# Patient Record
Sex: Female | Born: 1965 | Race: White | Hispanic: No | Marital: Married | Smoking: Never smoker
Health system: Southern US, Community
[De-identification: ages and names within clinical notes are randomized; demographics above are authoritative.]

## PROBLEM LIST (undated history)

## (undated) DIAGNOSIS — Z85828 Personal history of other malignant neoplasm of skin: Secondary | ICD-10-CM

## (undated) DIAGNOSIS — L570 Actinic keratosis: Secondary | ICD-10-CM

## (undated) DIAGNOSIS — J040 Acute laryngitis: Secondary | ICD-10-CM

## (undated) DIAGNOSIS — N6019 Diffuse cystic mastopathy of unspecified breast: Secondary | ICD-10-CM

## (undated) HISTORY — DX: Personal history of other malignant neoplasm of skin: Z85.828

## (undated) HISTORY — DX: Actinic keratosis: L57.0

## (undated) HISTORY — DX: Diffuse cystic mastopathy of unspecified breast: N60.19

## (undated) HISTORY — DX: Acute laryngitis: J04.0

---

## 1990-01-09 HISTORY — PX: TUBAL LIGATION: SHX77

## 2004-03-01 ENCOUNTER — Ambulatory Visit: Payer: Self-pay | Admitting: Unknown Physician Specialty

## 2005-03-21 ENCOUNTER — Ambulatory Visit: Payer: Self-pay | Admitting: Unknown Physician Specialty

## 2006-03-23 ENCOUNTER — Ambulatory Visit: Payer: Self-pay | Admitting: Unknown Physician Specialty

## 2007-03-28 ENCOUNTER — Ambulatory Visit: Payer: Self-pay | Admitting: Unknown Physician Specialty

## 2008-03-31 ENCOUNTER — Ambulatory Visit: Payer: Self-pay | Admitting: Unknown Physician Specialty

## 2009-01-09 DIAGNOSIS — J040 Acute laryngitis: Secondary | ICD-10-CM

## 2009-01-09 DIAGNOSIS — N6019 Diffuse cystic mastopathy of unspecified breast: Secondary | ICD-10-CM

## 2009-01-09 HISTORY — DX: Diffuse cystic mastopathy of unspecified breast: N60.19

## 2009-01-09 HISTORY — DX: Acute laryngitis: J04.0

## 2009-04-01 ENCOUNTER — Ambulatory Visit: Payer: Self-pay | Admitting: Unknown Physician Specialty

## 2009-06-28 ENCOUNTER — Emergency Department: Payer: Self-pay | Admitting: Emergency Medicine

## 2010-01-09 HISTORY — PX: BREAST BIOPSY: SHX20

## 2010-04-04 ENCOUNTER — Ambulatory Visit: Payer: Self-pay | Admitting: General Surgery

## 2010-04-05 ENCOUNTER — Ambulatory Visit: Payer: Self-pay | Admitting: General Surgery

## 2010-04-25 ENCOUNTER — Ambulatory Visit: Payer: Self-pay | Admitting: General Surgery

## 2010-04-27 LAB — PATHOLOGY REPORT

## 2010-09-06 ENCOUNTER — Ambulatory Visit: Payer: Self-pay | Admitting: General Surgery

## 2011-01-10 HISTORY — PX: COLONOSCOPY: SHX174

## 2011-04-14 ENCOUNTER — Ambulatory Visit: Payer: Self-pay | Admitting: General Surgery

## 2012-01-10 HISTORY — PX: BREAST BIOPSY: SHX20

## 2012-03-02 ENCOUNTER — Encounter: Payer: Self-pay | Admitting: *Deleted

## 2012-04-15 ENCOUNTER — Encounter: Payer: Self-pay | Admitting: General Surgery

## 2012-04-15 ENCOUNTER — Ambulatory Visit: Payer: Self-pay | Admitting: General Surgery

## 2012-04-15 NOTE — Progress Notes (Signed)
Quick Note:  Make sure the additional views are done prior to her office visit ______ 

## 2012-04-17 ENCOUNTER — Encounter: Payer: Self-pay | Admitting: General Surgery

## 2012-04-17 ENCOUNTER — Ambulatory Visit: Payer: Self-pay | Admitting: General Surgery

## 2012-04-18 NOTE — Progress Notes (Signed)
Patient had additional views on 04-17-12 at [facility]. Report shows CAT 4 mammogram. Thanks.

## 2012-04-30 ENCOUNTER — Ambulatory Visit: Payer: Self-pay | Admitting: General Surgery

## 2012-05-14 ENCOUNTER — Encounter: Payer: Self-pay | Admitting: General Surgery

## 2012-05-14 ENCOUNTER — Ambulatory Visit (INDEPENDENT_AMBULATORY_CARE_PROVIDER_SITE_OTHER): Payer: BC Managed Care – PPO | Admitting: General Surgery

## 2012-05-14 VITALS — BP 124/72 | HR 76 | Resp 14 | Ht 63.0 in | Wt 138.0 lb

## 2012-05-14 DIAGNOSIS — R920 Mammographic microcalcification found on diagnostic imaging of breast: Secondary | ICD-10-CM

## 2012-05-14 NOTE — Patient Instructions (Addendum)
Patient advised on stereotactic biopsy which she is familiar with.  Patient has been scheduled for a right stereotactic biopsy at [facility] on 06-10-12 at 3 pm. This patient was offered to have biopsy completed on 05-23-12 but declined. She is aware of all instructions.

## 2012-05-14 NOTE — Progress Notes (Signed)
Patient ID: Sylvia Daugherty, female   DOB: Feb 09, 1965, 47 y.o.   MRN: [id number]  Chief Complaint  Patient presents with  . Other    mammo    HPI Sylvia Daugherty is a 47 y.o. female here today for her follow up mammogram done 04/17/12.  Patient reports no breast problems. Patient states she does breast self exams. Patient had right breast stereo biopsy of two micro calcifications in 2012, benign fibrous stroma with calcifications.                                                                                            HPI  History reviewed. No pertinent past medical history.  History reviewed. No pertinent past surgical history.  History reviewed. No pertinent family history.  Social History History  Substance Use Topics  . Smoking status: Never Smoker   . Smokeless tobacco: Never Used  . Alcohol Use: No    No Known Allergies  Current Outpatient Prescriptions  Medication Sig Dispense Refill  . MIMVEY 1-0.5 MG per tablet Take 1 mg by mouth daily.       No current facility-administered medications for this visit.    Review of Systems Review of Systems  Constitutional: Negative.   Respiratory: Negative.   Cardiovascular: Negative.     Blood pressure 124/72, pulse 76, resp. rate 14, height 5\' 3"  (1.6 m), weight 138 lb (62.596 kg).  Physical Exam Physical Exam  Constitutional: She is oriented to person, place, and time. She appears well-developed and well-nourished.  Eyes: Conjunctivae are normal. No scleral icterus.  Neck: Normal range of motion. Neck supple.  Cardiovascular: Normal rate, regular rhythm and normal heart sounds.   Pulmonary/Chest: Effort normal and breath sounds normal. Right breast exhibits no inverted nipple, no mass, no nipple discharge, no skin change and no tenderness. Left breast exhibits no inverted nipple, no mass, no nipple discharge, no skin change and no tenderness.  Lymphadenopathy:    She has no cervical adenopathy.    She has no axillary adenopathy.   Neurological: She is oriented to person, place, and time.    Data Reviewed Mammogram review shows a new area of micro calcifications in lateral right breast.  Assessment    New area of microcalcifications right breast     Plan    Recommended stereo biopsy and pt is agreeable.     Patient has been scheduled for a right stereotactic biopsy at [facility] on 06-10-12 at 3 pm. This patient was offered to have biopsy completed on 05-23-12 but declined. She is aware of all instructions.    Sylvia Daugherty G 05/15/2012, 5:49 AM

## 2012-05-15 ENCOUNTER — Encounter: Payer: Self-pay | Admitting: General Surgery

## 2012-06-06 ENCOUNTER — Ambulatory Visit: Payer: Self-pay | Admitting: General Surgery

## 2012-06-06 ENCOUNTER — Telehealth: Payer: Self-pay | Admitting: *Deleted

## 2012-06-06 DIAGNOSIS — R920 Mammographic microcalcification found on diagnostic imaging of breast: Secondary | ICD-10-CM

## 2012-06-06 NOTE — Telephone Encounter (Signed)
Per Erie Noe in [facility], patient will be out of town when results come back. Patient would like to be contacted at the following number: [phone]. Thanks.

## 2012-06-10 ENCOUNTER — Encounter: Payer: Self-pay | Admitting: General Surgery

## 2012-06-11 ENCOUNTER — Ambulatory Visit: Payer: Self-pay | Admitting: Family Medicine

## 2012-06-13 ENCOUNTER — Ambulatory Visit (INDEPENDENT_AMBULATORY_CARE_PROVIDER_SITE_OTHER): Payer: BC Managed Care – PPO | Admitting: *Deleted

## 2012-06-13 DIAGNOSIS — R920 Mammographic microcalcification found on diagnostic imaging of breast: Secondary | ICD-10-CM

## 2012-06-13 NOTE — Patient Instructions (Addendum)
Patient here today for follow up post right breast biopsy.  Steristrip in place and aware it may come off in one week.  Minimal bruising noted.  The patient is aware that a heating pad may be used for comfort as needed.  Aware of pathology. Follow up as scheduled. 

## 2012-06-18 ENCOUNTER — Ambulatory Visit: Payer: BC Managed Care – PPO

## 2012-11-22 ENCOUNTER — Ambulatory Visit: Payer: Self-pay | Admitting: General Surgery

## 2012-11-25 ENCOUNTER — Encounter: Payer: Self-pay | Admitting: General Surgery

## 2012-12-04 ENCOUNTER — Ambulatory Visit: Payer: BC Managed Care – PPO | Admitting: General Surgery

## 2013-01-15 ENCOUNTER — Encounter: Payer: Self-pay | Admitting: *Deleted

## 2013-11-10 ENCOUNTER — Encounter: Payer: Self-pay | Admitting: *Deleted

## 2013-12-25 ENCOUNTER — Encounter: Payer: Self-pay | Admitting: General Surgery

## 2014-03-10 ENCOUNTER — Encounter: Payer: Self-pay | Admitting: General Surgery

## 2014-03-10 ENCOUNTER — Ambulatory Visit: Payer: Self-pay | Admitting: General Surgery

## 2014-03-12 ENCOUNTER — Ambulatory Visit: Payer: Self-pay | Admitting: General Surgery

## 2014-03-26 ENCOUNTER — Encounter: Payer: Self-pay | Admitting: General Surgery

## 2014-03-26 ENCOUNTER — Ambulatory Visit: Payer: 59

## 2014-03-26 ENCOUNTER — Ambulatory Visit (INDEPENDENT_AMBULATORY_CARE_PROVIDER_SITE_OTHER): Payer: 59 | Admitting: General Surgery

## 2014-03-26 VITALS — BP 112/70 | HR 70 | Resp 12 | Ht 63.0 in | Wt 130.0 lb

## 2014-03-26 DIAGNOSIS — N6310 Unspecified lump in the right breast, unspecified quadrant: Secondary | ICD-10-CM

## 2014-03-26 DIAGNOSIS — N63 Unspecified lump in breast: Secondary | ICD-10-CM

## 2014-03-26 HISTORY — PX: BREAST BIOPSY: SHX20

## 2014-03-26 NOTE — Progress Notes (Signed)
Patient ID: Sylvia Daugherty, female   DOB: [birth year], 49 y.o.   MRN: [id number]  Chief Complaint  Patient presents with  . Follow-up    mammogram    HPI Sylvia Daugherty is a 49 y.o. female.  who presents for her follow up breast evaluation. The most recent mammogram was done on 03-10-14.  Patient does perform regular self breast checks and gets regular mammograms done.  No new breast issues. Still hurts some in right axillary area  HPI  Past Medical History  Diagnosis Date  . Diffuse cystic mastopathy 2011    RIGHT   . Laryngitis 2011    spastic dysponia     Past Surgical History  Procedure Laterality Date  . Colonoscopy  2012    DR.ELLIOTT  . Tubal ligation  1992    Family History  Problem Relation Age of Onset  . Diabetes Father     Social History History  Substance Use Topics  . Smoking status: Never Smoker   . Smokeless tobacco: Never Used  . Alcohol Use: 0.0 oz/week    0 Standard drinks or equivalent per week    No Known Allergies  Current Outpatient Prescriptions  Medication Sig Dispense Refill  . B Complex Vitamins (VITAMIN B COMPLEX IJ) Inject as directed every Monday.    . Melatonin 5 MG TABS Take by mouth at bedtime.    . Progesterone Micronized (PROGESTERONE PO) Take by mouth daily.     No current facility-administered medications for this visit.    Review of Systems Review of Systems  Constitutional: Negative.   Respiratory: Negative.   Cardiovascular: Negative.     Blood pressure 112/70, pulse 70, resp. rate 12, height 5\' 3"  (1.6 m), weight 130 lb (58.968 kg).  Physical Exam Physical Exam  Constitutional: She is oriented to person, place, and time. She appears well-developed and well-nourished.  Eyes: Conjunctivae are normal. No scleral icterus.  Neck: Neck supple.  Cardiovascular: Normal rate, regular rhythm and normal heart sounds.   Pulmonary/Chest: Effort normal and breath sounds normal. Right breast exhibits mass. Right breast exhibits  no inverted nipple, no nipple discharge, no skin change and no tenderness. Left breast exhibits no inverted nipple, no mass, no nipple discharge, no skin change and no tenderness.  Firm to hard irregular, tender axillary tail of the right breast.  Abdominal: Soft. Normal appearance. There is no tenderness.  Lymphadenopathy:    She has no cervical adenopathy.    She has no axillary adenopathy.  Neurological: She is alert and oriented to person, place, and time.  Skin: Skin is warm and dry.    Data Reviewed Mammogram reviewed-stable. Targeted US of right breast axillary tail shows dense gland with mixed echogenicity. There is a 6-84mm ill defined hypoechoic mass which appears to be very tender.  Assessment    Tender right breast mass    Plan    Core biopsy discussed and pt agreeable. Completed today. If [path is benign will recheck in 6 mos.       Sylvia Daugherty G 03/26/2014, 10:08 AM

## 2014-03-26 NOTE — Patient Instructions (Addendum)
Continue self breast exams. Call office for any new breast issues or concerns.    CARE AFTER BREAST BIOPSY  1. Leave the dressing on that your doctor applied after surgery. It is waterproof. You may bathe, shower and/or swim. The dressing will probably remain intact until your return office visit. If the dressing comes off, you will see small strips of tape against your skin on the incision. Do not remove these strips.  2. You may want to use a gauze,cloth or similar protection in your bra to prevent rubbing against your dressing and incision. This is not necessary, but you may feel more comfortable doing so.  3. It is recommended that you wear a bra day and night to give support to the breast. This will prevent the weight of the breast from pulling on the incision.  4. Your breast will feel hard and lumpy under the incision. Do not be alarmed. This is the underlying stitching of tissue. Softening of this tissue will occur in time.  5. Make sure you call the office and schedule an appointment in one week after your surgery. The office phone number is [phone]. The nurses at [facility] may have already done this for you.  6. You will notice about a week after your office visit that the strips of the tape on your incision will begin to loosen. These may then be removed.  7. Report to your doctor any of the following:  * Severe pain not relieved by your pain medication  *Redness of the incision  * Drainage from the incision  *Fever greater than 101 degrees 

## 2014-03-27 ENCOUNTER — Telehealth: Payer: Self-pay

## 2014-03-27 NOTE — Telephone Encounter (Signed)
Pathology report received from Dr Saralyn Pilar from [facility]. Fibrocystic changes consistent with calcifications. Ralls. No evidence of malignancy. Dr Jamal Collin notified of such and instructions received to call patient with results and have her return for a recheck in 6 months. Called and spoke with patient and advised of results. Patient pleased. Patient will be placed in recalls for a 6 month follow up appointment. Patient is agreeable to this.

## 2014-08-31 ENCOUNTER — Ambulatory Visit: Payer: 59 | Admitting: Podiatry

## 2014-09-08 ENCOUNTER — Ambulatory Visit (INDEPENDENT_AMBULATORY_CARE_PROVIDER_SITE_OTHER): Payer: Managed Care, Other (non HMO) | Admitting: Obstetrics and Gynecology

## 2014-09-08 ENCOUNTER — Encounter: Payer: Self-pay | Admitting: Obstetrics and Gynecology

## 2014-09-08 VITALS — BP 106/60 | HR 69 | Ht 64.0 in | Wt 126.8 lb

## 2014-09-08 DIAGNOSIS — L659 Nonscarring hair loss, unspecified: Secondary | ICD-10-CM | POA: Diagnosis not present

## 2014-09-08 DIAGNOSIS — E049 Nontoxic goiter, unspecified: Secondary | ICD-10-CM | POA: Diagnosis not present

## 2014-09-08 NOTE — Patient Instructions (Signed)
Thank you for enrolling in [facility]. Please follow the instructions below to securely access your online medical record. MyChart allows you to send messages to your doctor, view your test results, renew your prescriptions, schedule appointments, and more.  How Do I Sign Up? 1. In your Internet browser, go to [email or web address]. 2. Click on the New  User? link in the Sign In box.  3. Enter your MyChart Access Code exactly as it appears below. You will not need to use this code after you have completed the sign-up process. If you do not sign up before the expiration date, you must request a new code. MyChart Access Code: [email or web address] Expires: 11/07/2014  3:00 PM  4. Enter the last four digits of your Social Security Number (xxxx) and Date of Birth (mm/dd/yyyy) as indicated and click Next. You will be taken to the next sign-up page. 5. Create a MyChart ID. This will be your MyChart login ID and cannot be changed, so think of one that is secure and easy to remember. 6. Create a MyChart password. You can change your password at any time. 7. Enter your Password Reset Question and Answer and click Next. This can be used at a later time if you forget your password.  8. Select your communication preference, and if applicable enter your e-mail address. You will receive e-mail notification when new information is available in MyChart by choosing to receive e-mail notifications and filling in your e-mail. 9. Click Sign In. You can now view your medical record.   Additional Information If you have questions, you can email [email or web address] or call [phone] to talk to our [facility] staff. Remember, MyChart is NOT to be used for urgent needs. For medical emergencies, dial 911.

## 2014-09-08 NOTE — Progress Notes (Signed)
Subjective:     Patient ID: Sylvia Daugherty, female   DOB: [birth year], 49 y.o.   MRN: [id number]  HPI Reports sudden onset hair loss x 2- 3 months. 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  Has not changed medications or added new medications; does report increased stress with work and traveling a lot.    Review of Systems Hair loss on head only.    Objective:   Physical Exam A&O X4 Well groomed thin female in no distress Thin hair at temples and back of hair, no apparent bald spots, lots of new hair growth noted. Thyroid non-tender but enlarged left lobe noted with probable nodule palpated approximately 109mm. HR RRR Skin normal without lesions or unusual dryness    Assessment:     Hair loss Enlarged left thyroid lobe     Plan:     Labs obtained; thyroid ultrasound ordered- will follow up accordingly.  Ioanna Colquhoun Trudee Kuster, CNM

## 2014-09-09 LAB — CBC
HEMATOCRIT: 40.8 % (ref 34.0–46.6)
Hemoglobin: 13.7 g/dL (ref 11.1–15.9)
MCH: 32.2 pg (ref 26.6–33.0)
MCHC: 33.6 g/dL (ref 31.5–35.7)
MCV: 96 fL (ref 79–97)
Platelets: 312 10*3/uL (ref 150–379)
RBC: 4.26 x10E6/uL (ref 3.77–5.28)
RDW: 13.5 % (ref 12.3–15.4)
WBC: 6.8 10*3/uL (ref 3.4–10.8)

## 2014-09-09 LAB — COMPREHENSIVE METABOLIC PANEL
ALK PHOS: 44 IU/L (ref 39–117)
ALT: 8 IU/L (ref 0–32)
AST: 12 IU/L (ref 0–40)
Albumin/Globulin Ratio: 1.8 (ref 1.1–2.5)
Albumin: 4.5 g/dL (ref 3.5–5.5)
BILIRUBIN TOTAL: 0.3 mg/dL (ref 0.0–1.2)
BUN / CREAT RATIO: 14 (ref 9–23)
BUN: 13 mg/dL (ref 6–24)
CO2: 23 mmol/L (ref 18–29)
Calcium: 9.6 mg/dL (ref 8.7–10.2)
Chloride: 100 mmol/L (ref 97–108)
Creatinine, Ser: 0.9 mg/dL (ref 0.57–1.00)
GFR calc Af Amer: 86 mL/min/{1.73_m2} (ref >59)
GFR calc non Af Amer: 75 mL/min/{1.73_m2} (ref >59)
Globulin, Total: 2.5 g/dL (ref 1.5–4.5)
Glucose: 88 mg/dL (ref 65–99)
POTASSIUM: 4.3 mmol/L (ref 3.5–5.2)
Sodium: 140 mmol/L (ref 134–144)
Total Protein: 7 g/dL (ref 6.0–8.5)

## 2014-09-09 LAB — THYROGLOBULIN ANTIBODY

## 2014-09-09 LAB — FERRITIN: Ferritin: 54 ng/mL (ref 15–150)

## 2014-09-09 LAB — VITAMIN B12: Vitamin B-12: 278 pg/mL (ref 211–946)

## 2014-09-09 LAB — THYROID PANEL WITH TSH
FREE THYROXINE INDEX: 2.2 (ref 1.2–4.9)
T3 Uptake Ratio: 29 % (ref 24–39)
T4, Total: 7.7 ug/dL (ref 4.5–12.0)
TSH: 0.789 u[IU]/mL (ref 0.450–4.500)

## 2014-09-09 LAB — PROGESTERONE: PROGESTERONE: 0.6 ng/mL

## 2014-09-09 LAB — DHEA-SULFATE: DHEA SO4: 214.5 ug/dL (ref 41.2–243.7)

## 2014-09-09 LAB — PROLACTIN: Prolactin: 11.3 ng/mL (ref 4.8–23.3)

## 2014-09-09 LAB — VITAMIN D 25 HYDROXY (VIT D DEFICIENCY, FRACTURES): Vit D, 25-Hydroxy: 50.4 ng/mL (ref 30.0–100.0)

## 2014-09-09 LAB — ESTRADIOL: Estradiol: 14.2 pg/mL

## 2014-09-09 LAB — TESTOSTERONE, FREE, TOTAL, SHBG: Testosterone, Free: 1.7 pg/mL (ref 0.0–4.2)

## 2014-09-10 ENCOUNTER — Telehealth: Payer: Self-pay | Admitting: *Deleted

## 2014-09-10 NOTE — Telephone Encounter (Signed)
-----   Message from Evonnie Pat, [place] sent at 09/09/2014 10:33 AM EDT ----- Please let her know all labs are normal

## 2014-09-10 NOTE — Telephone Encounter (Signed)
Notified pt of lab results 

## 2014-09-11 ENCOUNTER — Encounter: Payer: Self-pay | Admitting: General Surgery

## 2014-09-11 ENCOUNTER — Ambulatory Visit
Admission: RE | Admit: 2014-09-11 | Discharge: 2014-09-11 | Disposition: A | Discharge status: Home / Self Care | Payer: Managed Care, Other (non HMO) | Source: Ambulatory Visit | Attending: Obstetrics and Gynecology | Admitting: Obstetrics and Gynecology

## 2014-09-11 DIAGNOSIS — E049 Nontoxic goiter, unspecified: Secondary | ICD-10-CM

## 2014-09-11 DIAGNOSIS — L659 Nonscarring hair loss, unspecified: Secondary | ICD-10-CM | POA: Diagnosis present

## 2014-09-11 DIAGNOSIS — E041 Nontoxic single thyroid nodule: Secondary | ICD-10-CM | POA: Diagnosis not present

## 2014-09-11 DIAGNOSIS — E010 Iodine-deficiency related diffuse (endemic) goiter: Secondary | ICD-10-CM | POA: Diagnosis not present

## 2014-09-22 ENCOUNTER — Encounter: Payer: Self-pay | Admitting: General Surgery

## 2014-09-22 ENCOUNTER — Ambulatory Visit (INDEPENDENT_AMBULATORY_CARE_PROVIDER_SITE_OTHER): Payer: 59 | Admitting: General Surgery

## 2014-09-22 VITALS — BP 112/64 | HR 80 | Resp 12 | Ht 64.0 in | Wt 125.0 lb

## 2014-09-22 DIAGNOSIS — R229 Localized swelling, mass and lump, unspecified: Secondary | ICD-10-CM

## 2014-09-22 DIAGNOSIS — N6019 Diffuse cystic mastopathy of unspecified breast: Secondary | ICD-10-CM | POA: Diagnosis not present

## 2014-09-22 NOTE — Patient Instructions (Addendum)
The patient is aware to call back for any questions or concerns. Recheck left leg nodule in 1 month. Breast follow up in March next year with screening mammogram,

## 2014-09-22 NOTE — Progress Notes (Signed)
Patient ID: Sylvia Daugherty, female   DOB: 1965-04-19, 49 y.o.   MRN: [id number]  Chief Complaint  Patient presents with  . Follow-up    HPI Sylvia Daugherty is a 49 y.o. female.  Here today for follow up from right breast biopsy on 03-26-14. No new breast complaints.   She does have a knot in her left lower leg she would like checked. She states the knot has been there for about 5 weeks. She states it is tender to touch, no change in size.   HPI  Past Medical History  Diagnosis Date  . Diffuse cystic mastopathy 2011    RIGHT   . Laryngitis 2011    spastic dysponia     Past Surgical History  Procedure Laterality Date  . Colonoscopy  2012    DR.ELLIOTT  . Tubal ligation  1992  . Breast biopsy Right 03-26-14    FIBROCYSTIC CHANGES WITH CALC. and [place]    Family History  Problem Relation Age of Onset  . Diabetes Father     Social History Social History  Substance Use Topics  . Smoking status: Never Smoker   . Smokeless tobacco: Never Used  . Alcohol Use: 0.0 oz/week    0 Standard drinks or equivalent per week    No Known Allergies  Current Outpatient Prescriptions  Medication Sig Dispense Refill  . Melatonin 5 MG TABS Take by mouth at bedtime.    Marland Kitchen MIMVEY LO 0.5-0.1 MG per tablet Take 1 tablet by mouth daily.     . vitamin B-12 (CYANOCOBALAMIN) 1000 MCG tablet Take 1,000 mcg by mouth every 30 (thirty) days.     No current facility-administered medications for this visit.    Review of Systems Review of Systems  Constitutional: Negative.   Respiratory: Negative.   Cardiovascular: Negative.     Blood pressure 112/64, pulse 80, resp. rate 12, height 5\' 4"  (1.626 m), weight 125 lb (56.7 kg).  Physical Exam Physical Exam  Constitutional: She is oriented to person, place, and time. She appears well-developed and well-nourished.  HENT:  Mouth/Throat: Oropharynx is clear and moist.  Eyes: Conjunctivae are normal. No scleral icterus.  Neck: Neck supple.   Cardiovascular: Normal rate, regular rhythm and normal heart sounds.   Pulmonary/Chest: Effort normal and breath sounds normal. Right breast exhibits no inverted nipple, no mass, no nipple discharge, no skin change and no tenderness. Left breast exhibits no inverted nipple, no mass, no nipple discharge, no skin change and no tenderness.  Right breast previously noted firm large area in axillary tail is almost fully resolved.   Lymphadenopathy:    She has no cervical adenopathy.    She has no axillary adenopathy.  Neurological: She is alert and oriented to person, place, and time.  Skin: Skin is warm and dry.  There is a 1.5 cm long and 1 cm wide hard cutaneous/subcutaneous mass located left leg over the anteromedial surface 10 cm above medial malleolus the skin is tethered at this site without any color change. The nodule is minimally moveable in both directions.  Psychiatric: Her behavior is normal.    Data Reviewed Progress notes.  Assessment    Right breast previously noted firm large area in axillary tail is almost fully resolved. 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 This has not grown any since first noticed 5 weeks ago, likely a benign process.  Plan    Recheck left leg nodule in  1 month. Breast follow up in March next year with screening mammogram,     PCP:  Pcp   Maycel Riffe G 09/23/2014, 11:03 AM

## 2014-09-23 ENCOUNTER — Encounter: Payer: Self-pay | Admitting: General Surgery

## 2014-09-24 ENCOUNTER — Ambulatory Visit: Payer: Self-pay | Admitting: General Surgery

## 2014-10-05 ENCOUNTER — Encounter: Payer: Self-pay | Admitting: Obstetrics and Gynecology

## 2014-10-12 ENCOUNTER — Other Ambulatory Visit: Payer: Self-pay | Admitting: *Deleted

## 2014-10-12 MED ORDER — MELATONIN 5 MG PO TABS: 2.0000 | ORAL_TABLET | Freq: Every day | ORAL | Status: DC

## 2014-10-22 ENCOUNTER — Encounter: Payer: Self-pay | Admitting: General Surgery

## 2014-10-22 ENCOUNTER — Ambulatory Visit (INDEPENDENT_AMBULATORY_CARE_PROVIDER_SITE_OTHER): Payer: 59 | Admitting: General Surgery

## 2014-10-22 VITALS — BP 116/72 | HR 64 | Resp 12 | Ht 64.0 in | Wt 126.0 lb

## 2014-10-22 DIAGNOSIS — R229 Localized swelling, mass and lump, unspecified: Secondary | ICD-10-CM | POA: Diagnosis not present

## 2014-10-22 NOTE — Patient Instructions (Signed)
The patient is aware to call back for any questions or concerns.  

## 2014-10-22 NOTE — Progress Notes (Signed)
This is a 49 year old female here today following up from her one month left leg nodule. Patient states she is doing well. No change in the leg nodule and no pain.  There is a 1.5 cm long and 1 cm wide hard cutaneous/subcutaneous mass located left leg over the anteromedial surface 10 cm above medial malleolus the skin is tethered at this site without any color change. The nodule is minimally moveable in both directions.   Impression: skin cyst Plan: Monitor the area, call for changes or concerns. Follow up in March as scheduled.   PCP:  Pcp

## 2015-01-15 ENCOUNTER — Other Ambulatory Visit: Payer: Self-pay

## 2015-01-15 DIAGNOSIS — Z1231 Encounter for screening mammogram for malignant neoplasm of breast: Secondary | ICD-10-CM

## 2015-01-21 ENCOUNTER — Encounter: Payer: Self-pay | Admitting: *Deleted

## 2015-02-16 ENCOUNTER — Encounter: Payer: Self-pay | Admitting: Obstetrics and Gynecology

## 2015-02-16 ENCOUNTER — Ambulatory Visit (INDEPENDENT_AMBULATORY_CARE_PROVIDER_SITE_OTHER): Payer: Managed Care, Other (non HMO) | Admitting: Obstetrics and Gynecology

## 2015-02-16 VITALS — BP 102/62 | HR 74 | Ht 66.0 in | Wt 127.3 lb

## 2015-02-16 DIAGNOSIS — Z01419 Encounter for gynecological examination (general) (routine) without abnormal findings: Secondary | ICD-10-CM

## 2015-02-16 MED ORDER — MIMVEY LO 0.5-0.1 MG PO TABS: 1.0000 | ORAL_TABLET | Freq: Every day | ORAL | Status: DC

## 2015-02-16 MED ORDER — CYANOCOBALAMIN 1000 MCG/ML IJ SOLN: 1000.0000 ug | Freq: Once | INTRAMUSCULAR | Status: DC

## 2015-02-16 NOTE — Progress Notes (Signed)
Subjective:   Sylvia Daugherty is a 50 y.o. G21P0000 Caucasian female here for a routine well-woman exam.  No LMP recorded. Patient is not currently having periods (Reason: Perimenopausal).    Current complaints: occasional forgetfullness and hair loss PCP: Jamal Collin       Does  desire labs  Social History: Sexual: heterosexual Marital Status: married Living situation: with spouse Occupation: Assembly line Tobacco/alcohol: no alcohol use Illicit drugs: no history of illicit drug use  The following portions of the patient's history were reviewed and updated as appropriate: allergies, current medications, past family history, past medical history, past social history, past surgical history and problem list.  Past Medical History Past Medical History  Diagnosis Date  . Diffuse cystic mastopathy 2011    RIGHT   . Laryngitis 2011    spastic dysponia     Past Surgical History Past Surgical History  Procedure Laterality Date  . Colonoscopy  2012    DR.ELLIOTT  . Tubal ligation  1992  . Breast biopsy Right 03-26-14    FIBROCYSTIC CHANGES WITH CALC. and [place]    Gynecologic History G3P0000  No LMP recorded. Patient is not currently having periods (Reason: Perimenopausal). Contraception: tubal ligation Last Pap: 2015. Results were: normal Last mammogram: 2016. Results were: normal with fibrocystic  Obstetric History OB History  Gravida Para Term Preterm AB SAB TAB Ectopic Multiple Living  3 3 0 0 0 0 0 0      # Outcome Date GA Lbr Len/2nd Weight Sex Delivery Anes PTL Lv  3 Para           2 Para           1 Para             Obstetric Comments  FIRST PREGNANCY 16  FIRST MENSTRUAL 10    Current Medications Current Outpatient Prescriptions on File Prior to Visit  Medication Sig Dispense Refill  . Melatonin 5 MG TABS Take 2 tablets (10 mg total) by mouth at bedtime. 60 tablet 3   No current facility-administered medications on file prior to visit.    Review of  Systems Patient denies any headaches, blurred vision, shortness of breath, chest pain, abdominal pain, problems with bowel movements, urination, or intercourse.  Objective:  BP 102/62 mmHg  Pulse 74  Ht 5\' 6"  (1.676 m)  Wt 127 lb 4.8 oz (57.743 kg)  BMI 20.56 kg/m2 Physical Exam  General:  Well developed, well nourished, no acute distress. She is alert and oriented x3. Skin:  Warm and dry Neck:  Midline trachea, no thyromegaly or nodules Cardiovascular: Regular rate and rhythm, no murmur heard Lungs:  Effort normal, all lung fields clear to auscultation bilaterally Breasts:  No dominant palpable mass, retraction, or nipple discharge Abdomen:  Soft, non tender, no hepatosplenomegaly or masses Pelvic:  External genitalia is normal in appearance.  The vagina is normal in appearance. The cervix is bulbous, no CMT.  Thin prep pap is not done . Uterus is felt to be normal size, shape, and contour.  No adnexal masses or tenderness noted. Extremities:  No swelling or varicosities noted Psych:  She has a normal mood and affect  Assessment:   Healthy well-woman exam Sleep disturbance fatigue  Plan:  Labs obtained recommend stopping melatonin due to high doses, and try Sleep Now by [employer] Reassured I saw no signed of anemia, recommended decreasing life stressors, and not over-obligating herself. F/U 1 year for AE, or sooner if needed Mammogram scheduled  Jerold Yoss Rockney Ghee, CNM

## 2015-02-16 NOTE — Patient Instructions (Signed)
Place annual gynecologic exam patient instructions here.

## 2015-02-17 LAB — COMPREHENSIVE METABOLIC PANEL
ALBUMIN: 4.5 g/dL (ref 3.5–5.5)
ALT: 19 IU/L (ref 0–32)
AST: 19 IU/L (ref 0–40)
Albumin/Globulin Ratio: 2 (ref 1.1–2.5)
Alkaline Phosphatase: 38 IU/L — ABNORMAL LOW (ref 39–117)
BILIRUBIN TOTAL: 0.4 mg/dL (ref 0.0–1.2)
BUN / CREAT RATIO: 15 (ref 9–23)
BUN: 14 mg/dL (ref 6–24)
CO2: 25 mmol/L (ref 18–29)
CREATININE: 0.92 mg/dL (ref 0.57–1.00)
Calcium: 9.2 mg/dL (ref 8.7–10.2)
Chloride: 101 mmol/L (ref 96–106)
GFR calc non Af Amer: 72 mL/min/{1.73_m2} (ref >59)
GFR, EST AFRICAN AMERICAN: 83 mL/min/{1.73_m2} (ref >59)
GLOBULIN, TOTAL: 2.2 g/dL (ref 1.5–4.5)
GLUCOSE: 70 mg/dL (ref 65–99)
Potassium: 4.1 mmol/L (ref 3.5–5.2)
Sodium: 141 mmol/L (ref 134–144)
TOTAL PROTEIN: 6.7 g/dL (ref 6.0–8.5)

## 2015-02-17 LAB — LIPID PANEL
CHOLESTEROL TOTAL: 194 mg/dL (ref 100–199)
Chol/HDL Ratio: 2.5 ratio units (ref 0.0–4.4)
HDL: 78 mg/dL (ref >39)
LDL Calculated: 102 mg/dL — ABNORMAL HIGH (ref 0–99)
Triglycerides: 72 mg/dL (ref 0–149)
VLDL Cholesterol Cal: 14 mg/dL (ref 5–40)

## 2015-02-17 LAB — VITAMIN D 25 HYDROXY (VIT D DEFICIENCY, FRACTURES): Vit D, 25-Hydroxy: 38.9 ng/mL (ref 30.0–100.0)

## 2015-02-17 LAB — VITAMIN B12: Vitamin B-12: 307 pg/mL (ref 211–946)

## 2015-03-11 ENCOUNTER — Ambulatory Visit
Admission: RE | Admit: 2015-03-11 | Discharge: 2015-03-11 | Disposition: A | Discharge status: Home / Self Care | Payer: Managed Care, Other (non HMO) | Source: Ambulatory Visit | Attending: General Surgery | Admitting: General Surgery

## 2015-03-11 DIAGNOSIS — Z1231 Encounter for screening mammogram for malignant neoplasm of breast: Secondary | ICD-10-CM | POA: Insufficient documentation

## 2015-03-18 ENCOUNTER — Encounter: Payer: Self-pay | Admitting: General Surgery

## 2015-03-18 ENCOUNTER — Ambulatory Visit (INDEPENDENT_AMBULATORY_CARE_PROVIDER_SITE_OTHER): Payer: 59 | Admitting: General Surgery

## 2015-03-18 VITALS — BP 112/70 | HR 74 | Resp 12 | Ht 64.0 in | Wt 124.0 lb

## 2015-03-18 DIAGNOSIS — N6019 Diffuse cystic mastopathy of unspecified breast: Secondary | ICD-10-CM | POA: Diagnosis not present

## 2015-03-18 NOTE — Patient Instructions (Signed)
Patient will be asked to return to the office in one year with a bilateral screening mammogram. 

## 2015-03-18 NOTE — Progress Notes (Addendum)
Patient ID: Sylvia Daugherty, female   DOB: 06/25/1965, 50 y.o.   MRN: [id number]  Chief Complaint  Patient presents with  . Follow-up    mammogram    HPI [name] is a 50 y.o. female who presents for a breast evaluation. The most recent mammogram was done on 03/11/15.  Patient does perform regular self breast checks and gets regular mammograms done.   I have reviewed the history of present illness with the patient.  HPI  Past Medical History  Diagnosis Date  . Diffuse cystic mastopathy 2011    RIGHT   . Laryngitis 2011    spastic dysponia     Past Surgical History  Procedure Laterality Date  . Colonoscopy  2012    DR.ELLIOTT  . Tubal ligation  1992  . Breast biopsy Right 03-26-14    FIBROCYSTIC CHANGES WITH CALC. and PASH  . Breast biopsy Right 2012    core with clip  . Breast biopsy Right 2014    core with clip    Family History  Problem Relation Age of Onset  . Diabetes Father   . Breast cancer Neg Hx     Social History Social History  Substance Use Topics  . Smoking status: Never Smoker   . Smokeless tobacco: Never Used  . Alcohol Use: 0.0 oz/week    0 Standard drinks or equivalent per week    No Known Allergies  Current Outpatient Prescriptions  Medication Sig Dispense Refill  . cyanocobalamin (,VITAMIN B-12,) 1000 MCG/ML injection Inject 1 mL (1,000 mcg total) into the muscle once. 10 mL 1  . Melatonin 5 MG TABS Take 2 tablets (10 mg total) by mouth at bedtime. 60 tablet 3  . MIMVEY LO 0.5-0.1 MG tablet Take 1 tablet by mouth daily. 28 tablet 11   No current facility-administered medications for this visit.    Review of Systems Review of Systems  Constitutional: Negative.   Respiratory: Negative.   Cardiovascular: Negative.     Blood pressure 112/70, pulse 74, resp. rate 12, height 5\' 4"  (1.626 m), weight 124 lb (56.246 kg).  Physical Exam Physical Exam  Constitutional: She is oriented to person, place, and time. She appears well-developed  and well-nourished.  Eyes: Conjunctivae are normal. No scleral icterus.  Neck: Neck supple.  Cardiovascular: Normal rate, regular rhythm and normal heart sounds.   Pulmonary/Chest: Effort normal and breath sounds normal. Right breast exhibits tenderness (in area of previous biospy). Right breast exhibits no inverted nipple, no mass, no nipple discharge and no skin change. Left breast exhibits no inverted nipple, no mass, no nipple discharge, no skin change and no tenderness.  Abdominal: Soft. Bowel sounds are normal. There is no tenderness.  Lymphadenopathy:    She has no cervical adenopathy.    She has no axillary adenopathy.  Neurological: She is alert and oriented to person, place, and time.  Skin: Skin is warm and dry.    Data Reviewed Mammogram reviewed  Assessment    FCD, stable exam. Review of her last colonoscopy- done in 2013, small polyp removed from descending colon but not retrieved. Will need colonoscopy next yr.    Plan    Patient will be asked to return to the office in one year with a bilateral screening mammogram. Also colonoscopy discussionj    This information has been scribed by Gaspar Cola CMA.    SANKAR,SEEPLAPUTHUR G 03/18/2015, 10:01 AM

## 2015-06-23 ENCOUNTER — Other Ambulatory Visit: Payer: Self-pay

## 2015-06-23 MED ORDER — MELATONIN 5 MG PO TABS: 2.0000 | ORAL_TABLET | Freq: Every day | ORAL | Status: AC

## 2015-07-21 ENCOUNTER — Encounter: Payer: Self-pay | Admitting: General Surgery

## 2015-07-21 ENCOUNTER — Other Ambulatory Visit: Payer: Self-pay

## 2015-07-21 ENCOUNTER — Ambulatory Visit (INDEPENDENT_AMBULATORY_CARE_PROVIDER_SITE_OTHER): Payer: Managed Care, Other (non HMO) | Admitting: General Surgery

## 2015-07-21 VITALS — BP 102/68 | HR 64 | Resp 14 | Ht 64.0 in | Wt 124.0 lb

## 2015-07-21 DIAGNOSIS — N63 Unspecified lump in breast: Secondary | ICD-10-CM | POA: Diagnosis not present

## 2015-07-21 DIAGNOSIS — N6310 Unspecified lump in the right breast, unspecified quadrant: Secondary | ICD-10-CM

## 2015-07-21 NOTE — Progress Notes (Signed)
Patient ID: Sylvia Daugherty, female   DOB: 12/15/1965, 50 y.o.   MRN: [id number]  Chief Complaint  Patient presents with  . Follow-up    HPI Sylvia Daugherty is a 50 y.o. female.  who presents for a breast evaluation. She states she is having right breast tenderness and feels a lump for about a month. It has changed in size, little larger.  Patient does perform regular self breast checks and gets regular mammograms done.     HPI  Past Medical History  Diagnosis Date  . Diffuse cystic mastopathy 2011    RIGHT   . Laryngitis 2011    spastic dysponia     Past Surgical History  Procedure Laterality Date  . Colonoscopy  2013    DR.ELLIOTT  . Tubal ligation  1992  . Breast biopsy Right 03-26-14    FIBROCYSTIC CHANGES WITH CALC. and PASH  . Breast biopsy Right 2012    core with clip  . Breast biopsy Right 2014    core with clip    Family History  Problem Relation Age of Onset  . Diabetes Father   . Breast cancer Neg Hx     Social History Social History  Substance Use Topics  . Smoking status: Never Smoker   . Smokeless tobacco: Never Used  . Alcohol Use: 0.0 oz/week    0 Standard drinks or equivalent per week    No Known Allergies  Current Outpatient Prescriptions  Medication Sig Dispense Refill  . cyanocobalamin (,VITAMIN B-12,) 1000 MCG/ML injection Inject 1 mL (1,000 mcg total) into the muscle once. 10 mL 1  . Melatonin 5 MG TABS Take 2 tablets (10 mg total) by mouth at bedtime. 60 tablet 3  . MIMVEY LO 0.5-0.1 MG tablet Take 1 tablet by mouth daily. 28 tablet 11   No current facility-administered medications for this visit.    Review of Systems Review of Systems  Constitutional: Negative.   Respiratory: Negative.   Cardiovascular: Negative.     Blood pressure 102/68, pulse 64, resp. rate 14, height 5\' 4"  (1.626 m), weight 124 lb (56.246 kg).  Physical Exam Physical Exam  Constitutional: She is oriented to person, place, and time. She appears well-developed  and well-nourished.  Pulmonary/Chest: Right breast exhibits tenderness. Right breast exhibits no inverted nipple, no mass, no nipple discharge and no skin change. Left breast exhibits no inverted nipple, no mass, no nipple discharge, no skin change and no tenderness.  Ill defined 3 cm area of fullness and tenderness right breast 10-11 o'clock   Lymphadenopathy:    She has no axillary adenopathy.  Neurological: She is alert and oriented to person, place, and time.  Skin: Skin is warm and dry.  Psychiatric: Her behavior is normal.    Data Reviewed Prior notes. [place] of right breast over palpable fullness 10 to 11 ocl location shows prominence of gland but no defines mass or cyst. There is an oval anechoic mass 0.67cm size at 8cl 4cmfn-likely a cyst Assessment    Right breast tenderness and fullness in uoq, no finding at this sit on [place]. Pt advised.    Plan    Patient to return in two months . The patient is aware to use an antiinflammatory of choice (Advil or Aleve) as needed for comfort.     PCP:  Pcp none This information has been scribed by Karie Fetch RN, BSN,BC.   Allon Costlow G 07/22/2015, 3:16 PM

## 2015-07-21 NOTE — Patient Instructions (Addendum)
The patient is aware to call back for any questions or concerns. Return in two months. The patient is aware to use an antiinflammatory of choice (Advil or Aleve) as needed for comfort.

## 2015-09-08 ENCOUNTER — Other Ambulatory Visit: Payer: Self-pay | Admitting: *Deleted

## 2015-09-08 MED ORDER — MIMVEY LO 0.5-0.1 MG PO TABS: 1.0000 | ORAL_TABLET | Freq: Every day | ORAL | 11 refills | Status: DC

## 2015-09-21 ENCOUNTER — Ambulatory Visit (INDEPENDENT_AMBULATORY_CARE_PROVIDER_SITE_OTHER): Payer: Managed Care, Other (non HMO) | Admitting: General Surgery

## 2015-09-21 ENCOUNTER — Encounter: Payer: Self-pay | Admitting: General Surgery

## 2015-09-21 VITALS — BP 102/60 | HR 64 | Resp 12 | Ht 64.0 in | Wt 131.0 lb

## 2015-09-21 DIAGNOSIS — N6011 Diffuse cystic mastopathy of right breast: Secondary | ICD-10-CM | POA: Diagnosis not present

## 2015-09-21 NOTE — Patient Instructions (Addendum)
The patient is aware to call back for any questions or concerns. May use anti inflammatory as needed for comfort. Recommend vitamin E supplement which may help. Follow up March 2018 as scheduled

## 2015-09-21 NOTE — Progress Notes (Signed)
Patient ID: Sylvia Daugherty, female   DOB: 12-02-1965, 50 y.o.   MRN: [id number]  Chief Complaint  Patient presents with  . Follow-up    breast cyst    [name] is a 50 y.o. female here today for 2 month follow up right breast tenderness and palpable thickening upper outer quadrant. She is still having some tenderness with touch. No new breast issues.  I have reviewed the history of present illness with the patient.  HPI  Past Medical History:  Diagnosis Date  . Diffuse cystic mastopathy 2011   RIGHT   . Laryngitis 2011   spastic dysponia     Past Surgical History:  Procedure Laterality Date  . BREAST BIOPSY Right 03-26-14   FIBROCYSTIC CHANGES WITH CALC. and PASH  . BREAST BIOPSY Right 2012   core with clip  . BREAST BIOPSY Right 2014   core with clip  . COLONOSCOPY  2013   DR.ELLIOTT  . TUBAL LIGATION  1992    Family History  Problem Relation Age of Onset  . Diabetes Father   . Breast cancer Neg Hx     Social History Social History  Substance Use Topics  . Smoking status: Never Smoker  . Smokeless tobacco: Never Used  . Alcohol use 0.0 oz/week    No Known Allergies  Current Outpatient Prescriptions  Medication Sig Dispense Refill  . cyanocobalamin (,VITAMIN B-12,) 1000 MCG/ML injection Inject 1 mL (1,000 mcg total) into the muscle once. 10 mL 1  . Melatonin 5 MG TABS Take 2 tablets (10 mg total) by mouth at bedtime. 60 tablet 3  . MIMVEY LO 0.5-0.1 MG tablet Take 1 tablet by mouth daily. 28 tablet 11   No current facility-administered medications for this visit.     Review of Systems Review of Systems  Constitutional: Negative.   Respiratory: Negative.   Cardiovascular: Negative.     Blood pressure 102/60, pulse 64, resp. rate 12, height 5\' 4"  (1.626 m), weight 131 lb (59.4 kg).  Physical Exam Physical Exam  Constitutional: She is oriented to person, place, and time. She appears well-developed and well-nourished.  Pulmonary/Chest: Right  breast exhibits no inverted nipple, no mass, no nipple discharge and no skin change. Left breast exhibits no inverted nipple, no mass, no nipple discharge, no skin change and no tenderness.  Ill defined 1.5 cm area of fullness and tenderness right breast 10-11 o'clock. Previously measured 3 cm.   Lymphadenopathy:    She has no axillary adenopathy.  Neurological: She is alert and oriented to person, place, and time.  Skin: Skin is warm and dry.  Psychiatric: Her behavior is normal.    Data Reviewed Prior notes and ultrasound reviewed.  Assessment    Right breast thickening in uoq has notably decreased in size.  Symptoms and findings consistent with fibrocystic process. Pt reassured.  Plan    Follow up March 2018 as scheduled. May use anti inflammatory as needed for comfort. Recommend vitamin E supplement which may help.     The patient is aware to call back for any questions or concerns. This information has been scribed by Karie Fetch RN, BSN,BC.   Sylvia Daugherty 09/21/2015, 2:58 PM

## 2016-02-08 ENCOUNTER — Other Ambulatory Visit: Payer: Self-pay

## 2016-02-08 DIAGNOSIS — Z1231 Encounter for screening mammogram for malignant neoplasm of breast: Secondary | ICD-10-CM

## 2016-02-17 ENCOUNTER — Ambulatory Visit (INDEPENDENT_AMBULATORY_CARE_PROVIDER_SITE_OTHER): Payer: Managed Care, Other (non HMO) | Admitting: Obstetrics and Gynecology

## 2016-02-17 ENCOUNTER — Other Ambulatory Visit: Payer: Self-pay | Admitting: Obstetrics and Gynecology

## 2016-02-17 ENCOUNTER — Encounter: Payer: Self-pay | Admitting: Obstetrics and Gynecology

## 2016-02-17 VITALS — BP 105/64 | HR 63 | Ht 65.0 in | Wt 130.6 lb

## 2016-02-17 DIAGNOSIS — Z01419 Encounter for gynecological examination (general) (routine) without abnormal findings: Secondary | ICD-10-CM | POA: Diagnosis not present

## 2016-02-17 MED ORDER — MIMVEY LO 0.5-0.1 MG PO TABS: 1.0000 | ORAL_TABLET | Freq: Every day | ORAL | 11 refills | Status: DC

## 2016-02-17 MED ORDER — CYANOCOBALAMIN 1000 MCG/ML IJ SOLN: 1000.0000 ug | INTRAMUSCULAR | 1 refills | Status: DC

## 2016-02-17 NOTE — Progress Notes (Signed)
Subjective:   Sylvia Daugherty is a 51 y.o. G73P0000 Caucasian female here for a routine well-woman exam.  No LMP recorded. Patient is postmenopausal.    Current complaints: fatigue, sleeping well, exercising 4-5 days a week. PCP: Jamal Collin       does desire labs  Social History: Sexual: heterosexual Marital Status: married Living situation: with family Occupation: Scientist, water [employer] Tobacco/alcohol: no tobacco use Illicit drugs: no history of illicit drug use  The following portions of the patient's history were reviewed and updated as appropriate: allergies, current medications, past family history, past medical history, past social history, past surgical history and problem list.  Past Medical History Past Medical History:  Diagnosis Date  . Diffuse cystic mastopathy 2011   RIGHT   . Laryngitis 2011   spastic dysponia     Past Surgical History Past Surgical History:  Procedure Laterality Date  . BREAST BIOPSY Right 03-26-14   FIBROCYSTIC CHANGES WITH CALC. and PASH  . BREAST BIOPSY Right 2012   core with clip  . BREAST BIOPSY Right 2014   core with clip  . COLONOSCOPY  2013   DR.ELLIOTT  . TUBAL LIGATION  1992    Gynecologic History G3P0000  No LMP recorded. Patient is postmenopausal. Contraception: post menopausal status Last Pap: 2013?Marland Kitchen Results were: normal Last mammogram: 2017. Results were: normal   Obstetric History OB History  Gravida Para Term Preterm AB Living  3 3 0 0 0    SAB TAB Ectopic Multiple Live Births  0 0 0        # Outcome Date GA Lbr Len/2nd Weight Sex Delivery Anes PTL Lv  3 Para           2 Para           1 Para             Obstetric Comments  FIRST PREGNANCY 16  FIRST MENSTRUAL 10    Current Medications Current Outpatient Prescriptions on File Prior to Visit  Medication Sig Dispense Refill  . cyanocobalamin (,VITAMIN B-12,) 1000 MCG/ML injection Inject 1 mL (1,000 mcg total) into the muscle once. 10 mL 1  . Melatonin 5 MG TABS  Take 2 tablets (10 mg total) by mouth at bedtime. 60 tablet 3  . MIMVEY LO 0.5-0.1 MG tablet Take 1 tablet by mouth daily. 28 tablet 11   No current facility-administered medications on file prior to visit.     Review of Systems Patient denies any headaches, blurred vision, shortness of breath, chest pain, abdominal pain, problems with bowel movements, urination, or intercourse.  Objective:  BP 105/64   Pulse 63   Ht 5\' 5"  (1.651 m)   Wt 130 lb 9.6 oz (59.2 kg)   BMI 21.73 kg/m  Physical Exam  General:  Well developed, well nourished, no acute distress. She is alert and oriented x3. Skin:  Warm and dry Neck:  Midline trachea, no thyromegaly or nodules Cardiovascular: Regular rate and rhythm, no murmur heard Lungs:  Effort normal, all lung fields clear to auscultation bilaterally Breasts:  No dominant palpable mass, retraction, or nipple discharge Abdomen:  Soft, non tender, no hepatosplenomegaly or masses Pelvic:  External genitalia is normal in appearance.  The vagina is normal in appearance. The cervix is bulbous, no CMT.  Thin prep pap is done with HR HPV cotesting. Uterus is felt to be normal size, shape, and contour.  No adnexal masses or tenderness noted. Extremities:  No swelling or varicosities noted Psych:  She has a normal mood and affect  Assessment:   Healthy well-woman exam Fatigue S/p menopause  Plan:  Labs obtained and meds refilled Declined flu vaccine F/U 1 year for AE, or sooner if needed Mammogram ordered Colonoscopy needed  Dolly Harbach Rockney Ghee, CNM

## 2016-02-18 LAB — VITAMIN D 25 HYDROXY (VIT D DEFICIENCY, FRACTURES): Vit D, 25-Hydroxy: 49.3 ng/mL (ref 30.0–100.0)

## 2016-02-18 LAB — COMPREHENSIVE METABOLIC PANEL
ALT: 16 IU/L (ref 0–32)
AST: 15 IU/L (ref 0–40)
Albumin/Globulin Ratio: 1.6 (ref 1.2–2.2)
Albumin: 4.6 g/dL (ref 3.5–5.5)
Alkaline Phosphatase: 51 IU/L (ref 39–117)
BUN/Creatinine Ratio: 14 (ref 9–23)
BUN: 16 mg/dL (ref 6–24)
Bilirubin Total: 0.6 mg/dL (ref 0.0–1.2)
CALCIUM: 9.5 mg/dL (ref 8.7–10.2)
CO2: 24 mmol/L (ref 18–29)
CREATININE: 1.13 mg/dL — AB (ref 0.57–1.00)
Chloride: 99 mmol/L (ref 96–106)
GFR calc Af Amer: 65 mL/min/{1.73_m2} (ref >59)
GFR, EST NON AFRICAN AMERICAN: 56 mL/min/{1.73_m2} — AB (ref >59)
GLUCOSE: 80 mg/dL (ref 65–99)
Globulin, Total: 2.9 g/dL (ref 1.5–4.5)
Potassium: 4.3 mmol/L (ref 3.5–5.2)
Sodium: 141 mmol/L (ref 134–144)
Total Protein: 7.5 g/dL (ref 6.0–8.5)

## 2016-02-18 LAB — LIPID PANEL
CHOL/HDL RATIO: 2.6 ratio (ref 0.0–4.4)
Cholesterol, Total: 212 mg/dL — ABNORMAL HIGH (ref 100–199)
HDL: 81 mg/dL (ref >39)
LDL CALC: 119 mg/dL — AB (ref 0–99)
TRIGLYCERIDES: 59 mg/dL (ref 0–149)
VLDL Cholesterol Cal: 12 mg/dL (ref 5–40)

## 2016-02-18 LAB — CBC

## 2016-02-18 LAB — THYROID PANEL WITH TSH
Free Thyroxine Index: 2.5 (ref 1.2–4.9)
T3 Uptake Ratio: 31 % (ref 24–39)
T4 TOTAL: 8.2 ug/dL (ref 4.5–12.0)
TSH: 1.17 u[IU]/mL (ref 0.450–4.500)

## 2016-02-18 LAB — CYTOLOGY - PAP

## 2016-02-18 LAB — FERRITIN: FERRITIN: 77 ng/mL (ref 15–150)

## 2016-02-21 ENCOUNTER — Other Ambulatory Visit: Payer: Self-pay

## 2016-02-21 ENCOUNTER — Telehealth: Payer: Self-pay

## 2016-02-21 NOTE — Telephone Encounter (Signed)
Gastroenterology Pre-Procedure Review  Request Date:  Requesting Physician: Dr.   PATIENT REVIEW QUESTIONS: The patient responded to the following health history questions as indicated:    1. Are you having any GI issues? no 2. Do you have a personal history of Polyps? yes (cant remember when) 3. Do you have a family history of Colon Cancer or Polyps? no 4. Diabetes Mellitus? no 5. Joint replacements in the past 12 months?no 6. Major health problems in the past 3 months?no 7. Any artificial heart valves, MVP, or defibrillator?no    MEDICATIONS & ALLERGIES:    Patient reports the following regarding taking any anticoagulation/antiplatelet therapy:   Plavix, Coumadin, Eliquis, Xarelto, Lovenox, Pradaxa, Brilinta, or Effient? no Aspirin? no  Patient confirms/reports the following medications:  Current Outpatient Prescriptions  Medication Sig Dispense Refill  . cyanocobalamin (,VITAMIN B-12,) 1000 MCG/ML injection Inject 1 mL (1,000 mcg total) into the muscle every 30 (thirty) days. 10 mL 1  . Melatonin 5 MG TABS Take 2 tablets (10 mg total) by mouth at bedtime. 60 tablet 3  . MIMVEY LO 0.5-0.1 MG tablet Take 1 tablet by mouth daily. 28 tablet 11   No current facility-administered medications for this visit.     Patient confirms/reports the following allergies:  No Known Allergies  No orders of the defined types were placed in this encounter.   AUTHORIZATION INFORMATION Primary Insurance: 1D#: Group #:  Secondary Insurance: 1D#: Group #:  SCHEDULE INFORMATION: Date: 03/10/16 Time: Location: [place]

## 2016-02-22 ENCOUNTER — Encounter: Payer: Self-pay | Admitting: Obstetrics and Gynecology

## 2016-02-24 ENCOUNTER — Other Ambulatory Visit: Payer: Self-pay | Admitting: Obstetrics and Gynecology

## 2016-03-02 NOTE — Telephone Encounter (Signed)
Mel is pt taking this???

## 2016-03-07 ENCOUNTER — Encounter: Payer: Self-pay | Admitting: *Deleted

## 2016-03-09 NOTE — Discharge Instructions (Signed)

## 2016-03-10 ENCOUNTER — Encounter
Admission: RE | Disposition: A | Discharge status: Home / Self Care | Payer: Self-pay | Source: Ambulatory Visit | Attending: Gastroenterology

## 2016-03-10 ENCOUNTER — Ambulatory Visit: Payer: 59 | Admitting: Anesthesiology

## 2016-03-10 ENCOUNTER — Ambulatory Visit
Admission: RE | Admit: 2016-03-10 | Discharge: 2016-03-10 | Disposition: A | Discharge status: Home / Self Care | Payer: 59 | Source: Ambulatory Visit | Attending: Gastroenterology | Admitting: Gastroenterology

## 2016-03-10 DIAGNOSIS — D124 Benign neoplasm of descending colon: Secondary | ICD-10-CM

## 2016-03-10 DIAGNOSIS — K641 Second degree hemorrhoids: Secondary | ICD-10-CM | POA: Diagnosis not present

## 2016-03-10 DIAGNOSIS — Z1211 Encounter for screening for malignant neoplasm of colon: Secondary | ICD-10-CM

## 2016-03-10 DIAGNOSIS — Z79899 Other long term (current) drug therapy: Secondary | ICD-10-CM | POA: Diagnosis not present

## 2016-03-10 HISTORY — PX: POLYPECTOMY: SHX5525

## 2016-03-10 HISTORY — PX: COLONOSCOPY WITH PROPOFOL: SHX5780

## 2016-03-10 SURGERY — COLONOSCOPY WITH PROPOFOL
Anesthesia: Monitor Anesthesia Care | Wound class: Contaminated

## 2016-03-10 MED ORDER — PROPOFOL 10 MG/ML IV BOLUS
INTRAVENOUS | Status: DC | PRN
  Administered 2016-03-10: 50 mg via INTRAVENOUS
  Administered 2016-03-10: 30 mg via INTRAVENOUS
  Administered 2016-03-10: 100 mg via INTRAVENOUS
  Administered 2016-03-10: 30 mg via INTRAVENOUS
  Administered 2016-03-10: 50 mg via INTRAVENOUS
  Administered 2016-03-10 (×2): 30 mg via INTRAVENOUS

## 2016-03-10 MED ORDER — LACTATED RINGERS IV SOLN
INTRAVENOUS | Status: DC
  Administered 2016-03-10: 10:00:00 via INTRAVENOUS

## 2016-03-10 MED ORDER — SIMETHICONE 40 MG/0.6ML PO SUSP
ORAL | Status: DC | PRN
  Administered 2016-03-10: 11:00:00

## 2016-03-10 MED ORDER — LIDOCAINE HCL (CARDIAC) 20 MG/ML IV SOLN
INTRAVENOUS | Status: DC | PRN
  Administered 2016-03-10: 50 mg via INTRAVENOUS

## 2016-03-10 SURGICAL SUPPLY — 23 items

## 2016-03-10 NOTE — H&P (Signed)
  Lucilla Lame, MD [address], [place] [address] Phone: [phone] Fax : [phone]  Primary Care Physician:  Pcp Not In System Primary Gastroenterologist:  Dr. Allen Norris  Pre-Procedure History & Physical: HPI:  Sylvia Daugherty is a 51 y.o. female is here for a screening colonoscopy.   Past Medical History:  Diagnosis Date  . Diffuse cystic mastopathy 2011   RIGHT   . 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 BREAST BIOPSY Right 03-26-14   FIBROCYSTIC CHANGES WITH CALC. and PASH  . BREAST BIOPSY Right 2012   core with clip  . BREAST BIOPSY Right 2014   core with clip  . COLONOSCOPY  2013   DR.ELLIOTT  . TUBAL LIGATION  1992    Prior to Admission medications   Medication Sig Start Date End Date Taking? Authorizing Provider  cyanocobalamin (,VITAMIN B-12,) 1000 MCG/ML injection Inject 1 mL (1,000 mcg total) into the muscle every 30 (thirty) days. 02/17/16  Yes Melody N Shambley, CNM  Melatonin 5 MG TABS Take 2 tablets (10 mg total) by mouth at bedtime. 06/23/15  Yes Melody N Shambley, CNM  MIMVEY LO 0.5-0.1 MG tablet Take 1 tablet by mouth daily. 02/17/16  Yes Melody N Shambley, CNM  Estradiol-Norethindrone Acet 0.5-0.1 MG tablet TAKE 1 TABLET BY MOUTH DAILY. Patient not taking: Reported on 03/10/2016 03/02/16   Melody N Shambley, CNM    Allergies as of 02/21/2016  . (No Known Allergies)    Family History  Problem Relation Age of Onset  . Diabetes Father   . Breast cancer Neg Hx     Social History   Social History  . Marital status: Married    Spouse name: N/A  . Number of children: N/A  . Years of education: N/A   Occupational History  . Not on file.   Social History Main Topics  . Smoking status: Never Smoker  . Smokeless tobacco: Never Used  . Alcohol use 0.0 oz/week     Comment: rare - Holidays  . Drug use: No  . Sexual activity: Yes   Other Topics Concern  . Not on file   Social  History Narrative   ** Merged History Encounter **        Review of Systems: See HPI, otherwise negative ROS  Physical Exam: BP 105/70   Pulse 65   Temp 97.3 F (36.3 C) (Temporal)   Resp 16   Ht 5\' 5"  (1.651 m)   Wt 127 lb (57.6 kg)   SpO2 100%   BMI 21.13 kg/m  General:   Alert,  pleasant and cooperative in NAD Head:  Normocephalic and atraumatic. Neck:  Supple; no masses or thyromegaly. Lungs:  Clear throughout to auscultation.    Heart:  Regular rate and rhythm. Abdomen:  Soft, nontender and nondistended. Normal bowel sounds, without guarding, and without rebound.   Neurologic:  Alert and  oriented x4;  grossly normal neurologically.  Impression/Plan: Sylvia Daugherty is now here to undergo a screening colonoscopy.  Risks, benefits, and alternatives regarding colonoscopy have been reviewed with the patient.  Questions have been answered.  All parties agreeable.

## 2016-03-10 NOTE — Op Note (Signed)
[facility] Gastroenterology Patient Name: Sylvia Daugherty Procedure Date: 03/10/2016 11:04 AM MRN: [id number] Account #: [id number] Date of Birth: September 22, 1965 Admit Type: Outpatient Age: 51 Room: [facility] OR ROOM 01 Gender: Female Note Status: Finalized Procedure:            Colonoscopy Indications:          Screening for colorectal malignant neoplasm Providers:            Lucilla Lame MD, MD Medicines:            Propofol per Anesthesia Complications:        No immediate complications. Procedure:            Pre-Anesthesia Assessment:                       - Prior to the procedure, a History and Physical was                        performed, and patient medications and allergies were                        reviewed. The patient's tolerance of previous                        anesthesia was also reviewed. The risks and benefits of                        the procedure and the sedation options and risks were                        discussed with the patient. All questions were                        answered, and informed consent was obtained. Prior                        Anticoagulants: The patient has taken no previous                        anticoagulant or antiplatelet agents. ASA Grade                        Assessment: II - A patient with mild systemic disease.                        After reviewing the risks and benefits, the patient was                        deemed in satisfactory condition to undergo the                        procedure.                       After obtaining informed consent, the colonoscope was                        passed under direct vision. Throughout the procedure,                        the patient's blood pressure,  pulse, and oxygen                        saturations were monitored continuously. The [place] (S#: [id number]) was introduced through                        the anus and advanced to the the  cecum, identified by                        appendiceal orifice and ileocecal valve. The                        colonoscopy was performed without difficulty. The                        patient tolerated the procedure well. The quality of                        the bowel preparation was excellent. Findings:      The perianal and digital rectal examinations were normal.      A 2 mm polyp was found in the descending colon. The polyp was sessile.       The polyp was removed with a cold biopsy forceps. Resection and       retrieval were complete.      Non-bleeding internal hemorrhoids were found during retroflexion. The       hemorrhoids were Grade II (internal hemorrhoids that prolapse but reduce       spontaneously). Impression:           - One 2 mm polyp in the descending colon, removed with                        a cold biopsy forceps. Resected and retrieved.                       - Non-bleeding internal hemorrhoids. Recommendation:       - Discharge patient to home.                       - Resume previous diet.                       - Continue present medications.                       - Await pathology results.                       - Repeat colonoscopy in 5 years if polyp adenoma and 10                        years if hyperplastic Procedure Code(s):    --- Professional ---                       [id number], Colonoscopy, flexible; with biopsy, single or                        multiple Diagnosis Code(s):    ---  Professional ---                       Z12.11, Encounter for screening for malignant neoplasm                        of colon                       D12.4, Benign neoplasm of descending colon CPT copyright 2016 American Medical Association. All rights reserved. The codes documented in this report are preliminary and upon coder review may  be revised to meet current compliance requirements. Lucilla Lame MD, MD 03/10/2016 11:33:39 AM This report has been signed electronically. Number of  Addenda: 0 Note Initiated On: 03/10/2016 11:04 AM Scope Withdrawal Time: 0 hours 6 minutes 42 seconds  Total Procedure Duration: 0 hours 18 minutes 25 seconds       [facility]

## 2016-03-10 NOTE — Anesthesia Preprocedure Evaluation (Addendum)
Anesthesia Evaluation  Patient identified by MRN, date of birth, ID band Patient awake    Reviewed: Allergy & Precautions, H&P , NPO status , Patient's Chart, lab work & pertinent test results, reviewed documented beta blocker date and time   Airway Mallampati: II  TM Distance: >3 FB Neck ROM: full    Dental no notable dental hx.    Pulmonary neg pulmonary ROS,    Pulmonary exam normal breath sounds clear to auscultation       Cardiovascular Exercise Tolerance: Good negative cardio ROS   Rhythm:regular Rate:Normal     Neuro/Psych negative neurological ROS  negative psych ROS   GI/Hepatic negative GI ROS, Neg liver ROS,   Endo/Other  negative endocrine ROS  Renal/GU negative Renal ROS  negative genitourinary   Musculoskeletal   Abdominal   Peds  Hematology negative hematology ROS (+)   Anesthesia Other Findings Spastic dysphonia, treated with botox injections   Reproductive/Obstetrics negative OB ROS                            Anesthesia Physical Anesthesia Plan  ASA: I  Anesthesia Plan: MAC   Post-op Pain Management:    Induction:   Airway Management Planned:   Additional Equipment:   Intra-op Plan:   Post-operative Plan:   Informed Consent: I have reviewed the patients History and Physical, chart, labs and discussed the procedure including the risks, benefits and alternatives for the proposed anesthesia with the patient or authorized representative who has indicated his/her understanding and acceptance.   Dental Advisory Given  Plan Discussed with: CRNA  Anesthesia Plan Comments:         Anesthesia Quick Evaluation

## 2016-03-10 NOTE — Anesthesia Procedure Notes (Signed)
Procedure Name: MAC Performed by: Lawren Sexson Pre-anesthesia Checklist: Patient identified, Emergency Drugs available, Suction available, Timeout performed and Patient being monitored Patient Re-evaluated:Patient Re-evaluated prior to inductionOxygen Delivery Method: Nasal cannula Placement Confirmation: positive ETCO2     

## 2016-03-10 NOTE — Transfer of Care (Signed)
Immediate Anesthesia Transfer of Care Note  Patient: Sylvia Daugherty  Procedure(s) Performed: Procedure(s): COLONOSCOPY WITH PROPOFOL (N/A) POLYPECTOMY  Patient Location: PACU  Anesthesia Type: MAC  Level of Consciousness: awake, alert  and patient cooperative  Airway and Oxygen Therapy: Patient Spontanous Breathing and Patient connected to supplemental oxygen  Post-op Assessment: Post-op Vital signs reviewed, Patient's Cardiovascular Status Stable, Respiratory Function Stable, Patent Airway and No signs of Nausea or vomiting  Post-op Vital Signs: Reviewed and stable  Complications: No apparent anesthesia complications

## 2016-03-10 NOTE — Anesthesia Postprocedure Evaluation (Signed)
Anesthesia Post Note  Patient: Sylvia Daugherty  Procedure(s) Performed: Procedure(s) (LRB): COLONOSCOPY WITH PROPOFOL (N/A) POLYPECTOMY  Patient location during evaluation: PACU Anesthesia Type: MAC Level of consciousness: awake and alert Pain management: pain level controlled Vital Signs Assessment: post-procedure vital signs reviewed and stable Respiratory status: spontaneous breathing, nonlabored ventilation, respiratory function stable and patient connected to nasal cannula oxygen Cardiovascular status: stable and blood pressure returned to baseline Anesthetic complications: no    Alisa Graff

## 2016-03-13 ENCOUNTER — Encounter: Payer: Self-pay | Admitting: Gastroenterology

## 2016-03-14 ENCOUNTER — Ambulatory Visit
Admission: RE | Admit: 2016-03-14 | Discharge: 2016-03-14 | Disposition: A | Discharge status: Home / Self Care | Payer: 59 | Source: Ambulatory Visit | Attending: General Surgery | Admitting: General Surgery

## 2016-03-14 DIAGNOSIS — Z1231 Encounter for screening mammogram for malignant neoplasm of breast: Secondary | ICD-10-CM | POA: Diagnosis not present

## 2016-03-16 ENCOUNTER — Encounter: Payer: Self-pay | Admitting: Gastroenterology

## 2016-03-21 ENCOUNTER — Encounter: Payer: Self-pay | Admitting: General Surgery

## 2016-03-21 ENCOUNTER — Ambulatory Visit (INDEPENDENT_AMBULATORY_CARE_PROVIDER_SITE_OTHER): Payer: 59 | Admitting: General Surgery

## 2016-03-21 ENCOUNTER — Ambulatory Visit: Payer: Managed Care, Other (non HMO) | Admitting: General Surgery

## 2016-03-21 VITALS — BP 118/74 | HR 78 | Resp 12 | Ht 65.0 in | Wt 130.0 lb

## 2016-03-21 DIAGNOSIS — N6019 Diffuse cystic mastopathy of unspecified breast: Secondary | ICD-10-CM | POA: Diagnosis not present

## 2016-03-21 NOTE — Patient Instructions (Addendum)
The patient is aware to call back for any questions or concerns. Patient will follow annually with her gynecologist with a bilateral screening mammogram.

## 2016-03-21 NOTE — Progress Notes (Signed)
Patient ID: Sylvia Daugherty, female   DOB: [birth year], 51 y.o.   MRN: [id number]  Chief Complaint  Patient presents with  . Colonoscopy    HPI Sylvia Daugherty is a 51 y.o. female who presents for a breast evaluation. The most recent mammogram was done on 03-14-16.  Patient does perform regular self breast checks and gets regular mammograms done. No new breast issues.  Her colonoscopy was completed by Dr Allen Norris on 03-10-16 with no abnormal findings. I have reviewed the history of present illness with the patient.     HPI  Past Medical History:  Diagnosis Date  . Diffuse cystic mastopathy 2011   RIGHT   . 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 BREAST BIOPSY Right 03-26-14   FIBROCYSTIC CHANGES WITH CALC. and PASH  . BREAST BIOPSY Right 2012   core with clip  . BREAST BIOPSY Right 2014   core with clip  . COLONOSCOPY  2013   DR.ELLIOTT  . COLONOSCOPY WITH PROPOFOL N/A 03/10/2016   Procedure: COLONOSCOPY WITH PROPOFOL;  Surgeon: Lucilla Lame, MD;  Location: [facility];  Service: Endoscopy;  Laterality: N/A;  . POLYPECTOMY  03/10/2016   Procedure: POLYPECTOMY;  Surgeon: Lucilla Lame, MD;  Location: [facility];  Service: Endoscopy;;  . TUBAL LIGATION  1992    Family History  Problem Relation Age of Onset  . Diabetes Father   . Breast cancer Neg Hx     Social History Social History  Substance Use Topics  . Smoking status: Never Smoker  . Smokeless tobacco: Never Used  . Alcohol use 0.0 oz/week     Comment: rare - Holidays    No Known Allergies  Current Outpatient Prescriptions  Medication Sig Dispense Refill  . cyanocobalamin (,VITAMIN B-12,) 1000 MCG/ML injection Inject 1 mL (1,000 mcg total) into the muscle every 30 (thirty) days. 10 mL 1  . Melatonin 5 MG TABS Take 2 tablets (10 mg total) by mouth at bedtime. 60 tablet 3  . MIMVEY LO 0.5-0.1 MG tablet Take 1 tablet by mouth daily. 28 tablet 11   No  current facility-administered medications for this visit.     Review of Systems Review of Systems  Constitutional: Negative.   Respiratory: Negative.   Cardiovascular: Negative.     Blood pressure 118/74, pulse 78, resp. rate 12, height 5\' 5"  (1.651 m), weight 130 lb (59 kg).  Physical Exam Physical Exam  Constitutional: She is oriented to person, place, and time. She appears well-developed and well-nourished.  HENT:  Mouth/Throat: Oropharynx is clear and moist.  Eyes: Conjunctivae are normal. No scleral icterus.  Neck: Neck supple.  Cardiovascular: Normal rate, regular rhythm and normal heart sounds.   Pulmonary/Chest: Effort normal and breath sounds normal. Right breast exhibits no inverted nipple, no mass, no nipple discharge, no skin change and no tenderness. Left breast exhibits no inverted nipple, no mass, no nipple discharge, no skin change and no tenderness.  Abdominal: Soft. There is no tenderness.  Lymphadenopathy:    She has no cervical adenopathy.    She has no axillary adenopathy.  Neurological: She is alert and oriented to person, place, and time.  Skin: Skin is warm and dry.  Psychiatric: Her behavior is normal.    Data Reviewed  Mammogram and previous notes reviewed.  Assessment    Fibrocystic breast disease and PASH. Exam stable.     Plan    Patient will  follow annually with her gynecologist for  bilateral screening mammogram and breast exam.       This information has been scribed by Karie Fetch RN, BSN,BC.   SANKAR,SEEPLAPUTHUR G 03/21/2016, 2:52 PM

## 2016-04-19 ENCOUNTER — Encounter: Payer: Self-pay | Admitting: Obstetrics and Gynecology

## 2016-04-19 ENCOUNTER — Other Ambulatory Visit: Payer: Self-pay | Admitting: *Deleted

## 2016-04-19 MED ORDER — VALACYCLOVIR HCL 1 G PO TABS: 1000.0000 mg | ORAL_TABLET | Freq: Two times a day (BID) | ORAL | 3 refills | Status: DC

## 2016-08-12 ENCOUNTER — Other Ambulatory Visit: Payer: Self-pay | Admitting: Obstetrics and Gynecology

## 2017-02-13 DIAGNOSIS — C4491 Basal cell carcinoma of skin, unspecified: Secondary | ICD-10-CM

## 2017-02-13 HISTORY — DX: Basal cell carcinoma of skin, unspecified: C44.91

## 2017-02-22 ENCOUNTER — Encounter: Payer: Managed Care, Other (non HMO) | Admitting: Obstetrics and Gynecology

## 2017-02-27 ENCOUNTER — Ambulatory Visit (INDEPENDENT_AMBULATORY_CARE_PROVIDER_SITE_OTHER): Payer: BLUE CROSS/BLUE SHIELD | Admitting: Certified Nurse Midwife

## 2017-02-27 VITALS — BP 97/49 | HR 56 | Ht 65.0 in | Wt 129.1 lb

## 2017-02-27 DIAGNOSIS — Z0000 Encounter for general adult medical examination without abnormal findings: Secondary | ICD-10-CM

## 2017-02-27 NOTE — Progress Notes (Signed)
Pt is here for a physical. LPS 02/17/16 WNL. She would also like B12 level checked.

## 2017-02-27 NOTE — Progress Notes (Signed)
Unable to see pt . Had to leave to go to L&D for delivery.   Philip Aspen, CNM

## 2017-03-02 DIAGNOSIS — Z85828 Personal history of other malignant neoplasm of skin: Secondary | ICD-10-CM | POA: Insufficient documentation

## 2017-03-07 ENCOUNTER — Encounter: Payer: Self-pay | Admitting: Certified Nurse Midwife

## 2017-03-07 ENCOUNTER — Ambulatory Visit (INDEPENDENT_AMBULATORY_CARE_PROVIDER_SITE_OTHER): Payer: BLUE CROSS/BLUE SHIELD | Admitting: Certified Nurse Midwife

## 2017-03-07 VITALS — BP 99/55 | HR 63 | Ht 65.0 in | Wt 130.2 lb

## 2017-03-07 DIAGNOSIS — Z0000 Encounter for general adult medical examination without abnormal findings: Secondary | ICD-10-CM | POA: Diagnosis not present

## 2017-03-07 MED ORDER — CYANOCOBALAMIN 1000 MCG/ML IJ SOLN: 1000.0000 ug | INTRAMUSCULAR | 1 refills | Status: DC

## 2017-03-07 MED ORDER — VALACYCLOVIR HCL 1 G PO TABS: 1000.0000 mg | ORAL_TABLET | Freq: Two times a day (BID) | ORAL | 3 refills | Status: DC

## 2017-03-07 MED ORDER — MIMVEY LO 0.5-0.1 MG PO TABS: 1.0000 | ORAL_TABLET | Freq: Every day | ORAL | 11 refills | Status: DC

## 2017-03-07 NOTE — Progress Notes (Signed)
Pt is here for an annual physical.

## 2017-03-07 NOTE — Progress Notes (Signed)
GYNECOLOGY ANNUAL PREVENTATIVE CARE ENCOUNTER NOTE  Subjective:   Sylvia Daugherty is a 52 y.o. G65P0000 female here for a routine annual gynecologic exam.  Current complaints: none.   Denies abnormal vaginal bleeding, discharge, pelvic pain, problems with intercourse or other gynecologic concerns.    Gynecologic History No LMP recorded. Patient is postmenopausal. Contraception: post menopausal status Last Pap: 02/17/16. Results were: normal Last mammogram: 03/14/16. Results were: normal Colonoscopy: 03/10/16-negative Obstetric History OB History  Gravida Para Term Preterm AB Living  3 3 0 0 0    SAB TAB Ectopic Multiple Live Births  0 0 0        # Outcome Date GA Lbr Len/2nd Weight Sex Delivery Anes PTL Lv  3 Para           2 Para           1 Para             Obstetric Comments  FIRST PREGNANCY 16  FIRST MENSTRUAL 10    Past Medical History:  Diagnosis Date  . Diffuse cystic mastopathy 2011   RIGHT   . 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 BREAST BIOPSY Right 03-26-14   FIBROCYSTIC CHANGES WITH CALC. and PASH  . BREAST BIOPSY Right 2012   core with clip  . BREAST BIOPSY Right 2014   core with clip  . COLONOSCOPY  2013   DR.ELLIOTT  . COLONOSCOPY WITH PROPOFOL N/A 03/10/2016   Procedure: COLONOSCOPY WITH PROPOFOL;  Surgeon: Lucilla Lame, MD;  Location: [facility];  Service: Endoscopy;  Laterality: N/A;  . POLYPECTOMY  03/10/2016   Procedure: POLYPECTOMY;  Surgeon: Lucilla Lame, MD;  Location: [facility];  Service: Endoscopy;;  . TUBAL LIGATION  1992    Current Outpatient Medications on File Prior to Visit  Medication Sig Dispense Refill  . cyanocobalamin (,VITAMIN B-12,) 1000 MCG/ML injection Inject 1 mL (1,000 mcg total) into the muscle every 30 (thirty) days. 10 mL 1  . Melatonin 5 MG TABS Take 2 tablets (10 mg total) by mouth at bedtime. 60 tablet 3  . MIMVEY LO 0.5-0.1 MG tablet Take 1 tablet by  mouth daily. 28 tablet 11  . PICATO 0.05 % GEL APPLY ON THE SKIN AT BEDTIME APPLY TO CHEST.  1  . valACYclovir (VALTREX) 1000 MG tablet TAKE 1 TABLET (1,000 MG TOTAL) BY MOUTH 2 (TWO) TIMES DAILY. 60 tablet 3   No current facility-administered medications on file prior to visit.     No Known Allergies  Social History   Socioeconomic History  . Marital status: Married    Spouse name: Not on file  . Number of children: Not on file  . Years of education: Not on file  . Highest education level: Not on file  Social Needs  . Financial resource strain: Not on file  . Food insecurity - worry: Not on file  . Food insecurity - inability: Not on file  . Transportation needs - medical: Not on file  . Transportation needs - non-medical: Not on file  Occupational History  . Not on file  Tobacco Use  . Smoking status: Never Smoker  . Smokeless tobacco: Never Used  Substance and Sexual Activity  . Alcohol use: Yes    Alcohol/week: 0.0 oz    Comment: rare - Holidays  . Drug use: No  . Sexual activity: Yes  Other Topics Concern  . Not  on file  Social History Narrative   ** Merged History Encounter **        Family History  Problem Relation Age of Onset  . Diabetes Father   . Breast cancer Neg Hx     The following portions of the patient's history were reviewed and updated as appropriate: allergies, current medications, past family history, past medical history, past social history, past surgical history and problem list.  Review of Systems Pertinent items noted in HPI and remainder of comprehensive ROS otherwise negative.   Objective:  BP (!) 99/55   Pulse 63   Ht 5\' 5"  (1.651 m)   Wt 130 lb 4 oz (59.1 kg)   BMI 21.67 kg/m  CONSTITUTIONAL: Well-developed, well-nourished female in no acute distress.  HENT:  Normocephalic, atraumatic, External right and left ear normal. Oropharynx is clear and moist EYES: Conjunctivae and EOM are normal. Pupils are equal, round, and reactive  to light. No scleral icterus.  NECK: Normal range of motion, supple, no masses.  Normal thyroid.  SKIN: Skin is warm and dry. No rash noted. Not diaphoretic. No erythema. No pallor. NEUROLOGIC: Alert and oriented to person, place, and time. Normal reflexes, muscle tone coordination. No cranial nerve deficit noted. PSYCHIATRIC: Normal mood and affect. Normal behavior. Normal judgment and thought content. CARDIOVASCULAR: Normal heart rate noted, regular rhythm RESPIRATORY: Clear to auscultation bilaterally. Effort and breath sounds normal, no problems with respiration noted. BREASTS: Symmetric in size. No masses, skin changes, nipple drainage, or lymphadenopathy. Fibrocystic tissue. Pt states this is from biopsy's and clips.  ABDOMEN: Soft, normal bowel sounds, no distention noted.  No tenderness, rebound or guarding.  PELVIC: Normal appearing external genitalia; normal appearing vaginal mucosa and cervix.  No abnormal discharge noted.  Pap smear not indicated.  Normal uterine size, no other palpable masses, no uterine or adnexal tenderness. MUSCULOSKELETAL: Normal range of motion. No tenderness.  No cyanosis, clubbing, or edema.  2+ distal pulses.   Assessment and Plan:  Well Women Exam Pap smear not indicated. Mammogram scheduled Refills for Vitamin B-12, Mimvey, and valtrex Labs Vitamin B-12  Routine preventative health maintenance measures emphasized. Please refer to After Visit Summary for other counseling recommendations.   Philip Aspen, CNM

## 2017-03-07 NOTE — Patient Instructions (Signed)
Preventive Care 40-64 Years, Female Preventive care refers to lifestyle choices and visits with your health care provider that can promote health and wellness. What does preventive care include?  A yearly physical exam. This is also called an annual well check.  Dental exams once or twice a year.  Routine eye exams. Ask your health care provider how often you should have your eyes checked.  Personal lifestyle choices, including: ? Daily care of your teeth and gums. ? Regular physical activity. ? Eating a healthy diet. ? Avoiding tobacco and drug use. ? Limiting alcohol use. ? Practicing safe sex. ? Taking low-dose aspirin daily starting at age 58. ? Taking vitamin and mineral supplements as recommended by your health care provider. What happens during an annual well check? The services and screenings done by your health care provider during your annual well check will depend on your age, overall health, lifestyle risk factors, and family history of disease. Counseling Your health care provider may ask you questions about your:  Alcohol use.  Tobacco use.  Drug use.  Emotional well-being.  Home and relationship well-being.  Sexual activity.  Eating habits.  Work and work Statistician.  Method of birth control.  Menstrual cycle.  Pregnancy history.  Screening You may have the following tests or measurements:  Height, weight, and BMI.  Blood pressure.  Lipid and cholesterol levels. These may be checked every 5 years, or more frequently if you are over 81 years old.  Skin check.  Lung cancer screening. You may have this screening every year starting at age 78 if you have a 30-pack-year history of smoking and currently smoke or have quit within the past 15 years.  Fecal occult blood test (FOBT) of the stool. You may have this test every year starting at age 65.  Flexible sigmoidoscopy or colonoscopy. You may have a sigmoidoscopy every 5 years or a colonoscopy  every 10 years starting at age 30.  Hepatitis C blood test.  Hepatitis B blood test.  Sexually transmitted disease (STD) testing.  Diabetes screening. This is done by checking your blood sugar (glucose) after you have not eaten for a while (fasting). You may have this done every 1-3 years.  Mammogram. This may be done every 1-2 years. Talk to your health care provider about when you should start having regular mammograms. This may depend on whether you have a family history of breast cancer.  BRCA-related cancer screening. This may be done if you have a family history of breast, ovarian, tubal, or peritoneal cancers.  Pelvic exam and Pap test. This may be done every 3 years starting at age 80. Starting at age 36, this may be done every 5 years if you have a Pap test in combination with an HPV test.  Bone density scan. This is done to screen for osteoporosis. You may have this scan if you are at high risk for osteoporosis.  Discuss your test results, treatment options, and if necessary, the need for more tests with your health care provider. Vaccines Your health care provider may recommend certain vaccines, such as:  Influenza vaccine. This is recommended every year.  Tetanus, diphtheria, and acellular pertussis (Tdap, Td) vaccine. You may need a Td booster every 10 years.  Varicella vaccine. You may need this if you have not been vaccinated.  Zoster vaccine. You may need this after age 5.  Measles, mumps, and rubella (MMR) vaccine. You may need at least one dose of MMR if you were born in  1959 or later. You may also need a second dose.  Pneumococcal 13-valent conjugate (PCV13) vaccine. You may need this if you have certain conditions and were not previously vaccinated.  Pneumococcal polysaccharide (PPSV23) vaccine. You may need one or two doses if you smoke cigarettes or if you have certain conditions.  Meningococcal vaccine. You may need this if you have certain  conditions.  Hepatitis A vaccine. You may need this if you have certain conditions or if you travel or work in places where you may be exposed to hepatitis A.  Hepatitis B vaccine. You may need this if you have certain conditions or if you travel or work in places where you may be exposed to hepatitis B.  Haemophilus influenzae type b (Hib) vaccine. You may need this if you have certain conditions.  Talk to your health care provider about which screenings and vaccines you need and how often you need them. This information is not intended to replace advice given to you by your health care provider. Make sure you discuss any questions you have with your health care provider. Document Released: 01/22/2015 Document Revised: 09/15/2015 Document Reviewed: 10/27/2014 Elsevier Interactive Patient Education  2018 Elsevier Inc.  

## 2017-03-08 LAB — VITAMIN B12: VITAMIN B 12: 379 pg/mL (ref 232–1245)

## 2017-03-12 ENCOUNTER — Encounter: Payer: Self-pay | Admitting: Certified Nurse Midwife

## 2017-03-12 ENCOUNTER — Other Ambulatory Visit: Payer: Self-pay | Admitting: Obstetrics and Gynecology

## 2017-03-16 ENCOUNTER — Ambulatory Visit
Admission: RE | Admit: 2017-03-16 | Discharge: 2017-03-16 | Disposition: A | Discharge status: Home / Self Care | Payer: BLUE CROSS/BLUE SHIELD | Source: Ambulatory Visit | Attending: Certified Nurse Midwife | Admitting: Certified Nurse Midwife

## 2017-03-16 DIAGNOSIS — Z0000 Encounter for general adult medical examination without abnormal findings: Secondary | ICD-10-CM

## 2017-03-16 DIAGNOSIS — Z1231 Encounter for screening mammogram for malignant neoplasm of breast: Secondary | ICD-10-CM | POA: Diagnosis not present

## 2017-07-14 ENCOUNTER — Other Ambulatory Visit: Payer: Self-pay | Admitting: Obstetrics and Gynecology

## 2017-09-17 ENCOUNTER — Other Ambulatory Visit: Payer: Self-pay | Admitting: Obstetrics and Gynecology

## 2017-09-20 IMAGING — MG MM DIGITAL SCREENING BILAT W/ TOMO W/ CAD
9 of 13 series · 9 of 29 positions shown · non-contrast
Comparison: Previous exam(s).

CLINICAL DATA: Screening.

EXAM:
2D DIGITAL SCREENING BILATERAL MAMMOGRAM WITH CAD AND ADJUNCT TOMO

[L MLO (1 of 2)]
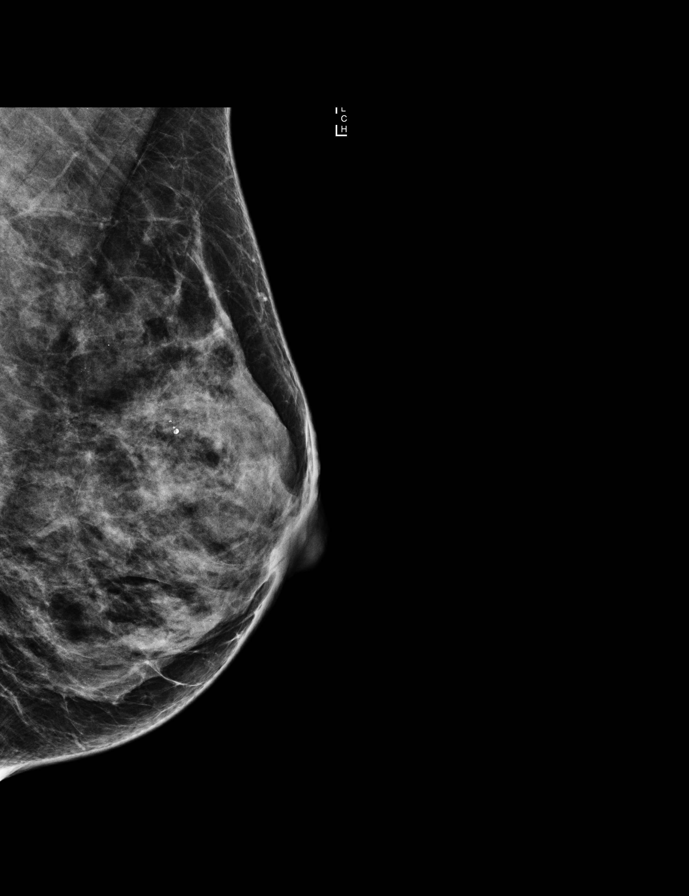

[L CC]
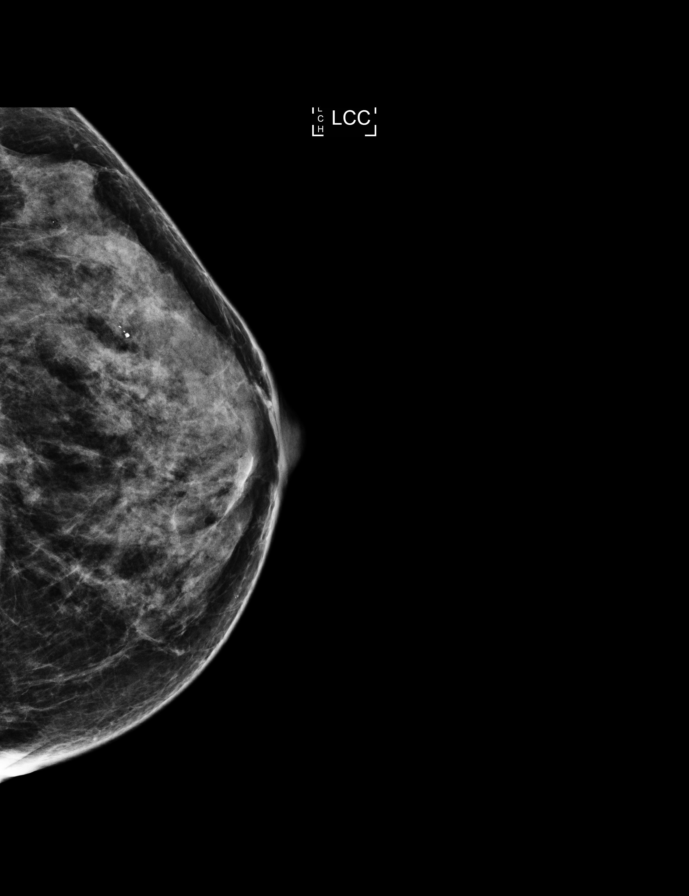

[L MLO (2 of 2)]
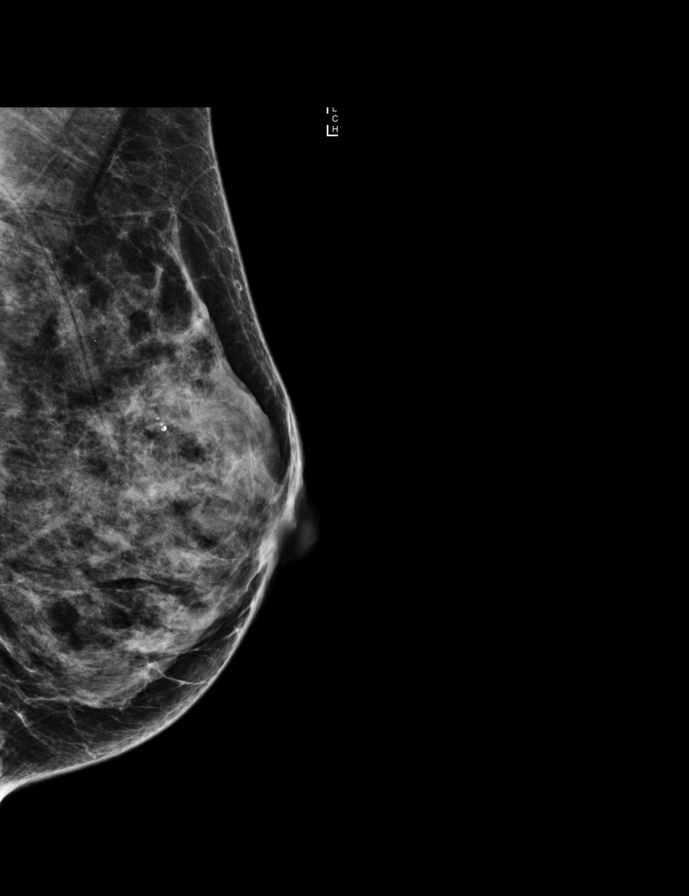

[R CC synth-2D]
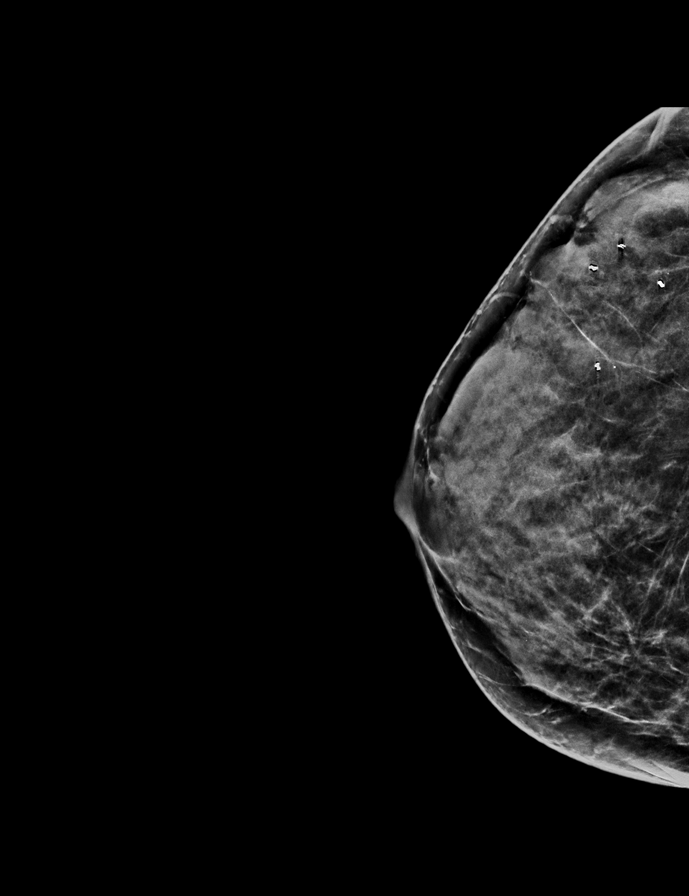

[R MLO]
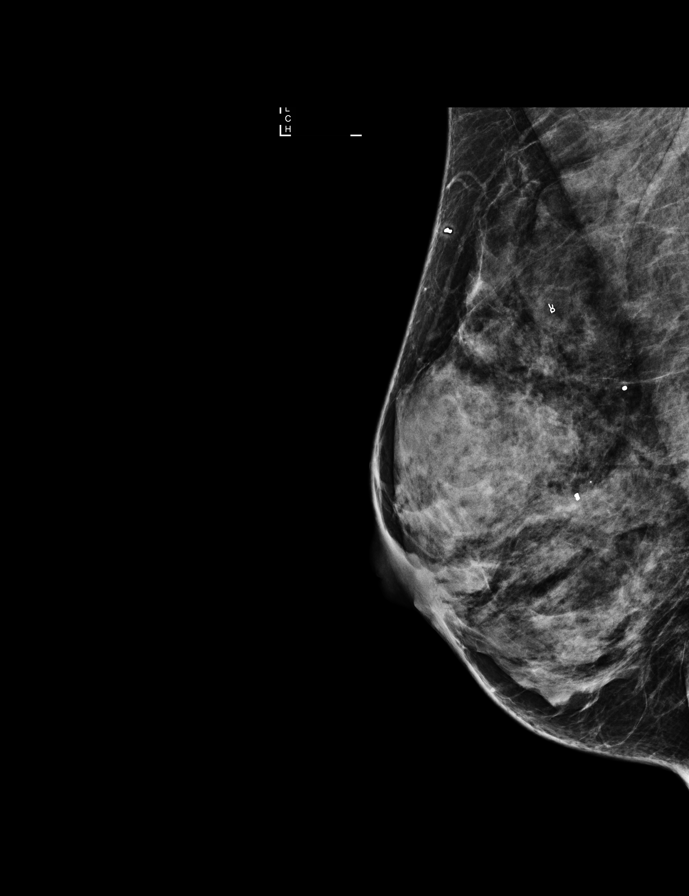

[L MLO synth-2D]
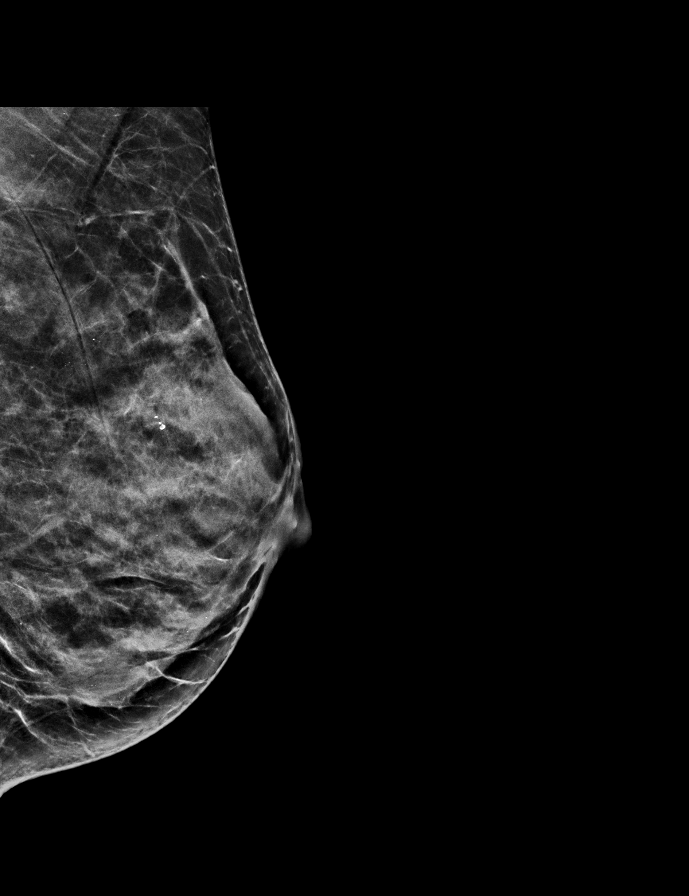

[R MLO synth-2D]
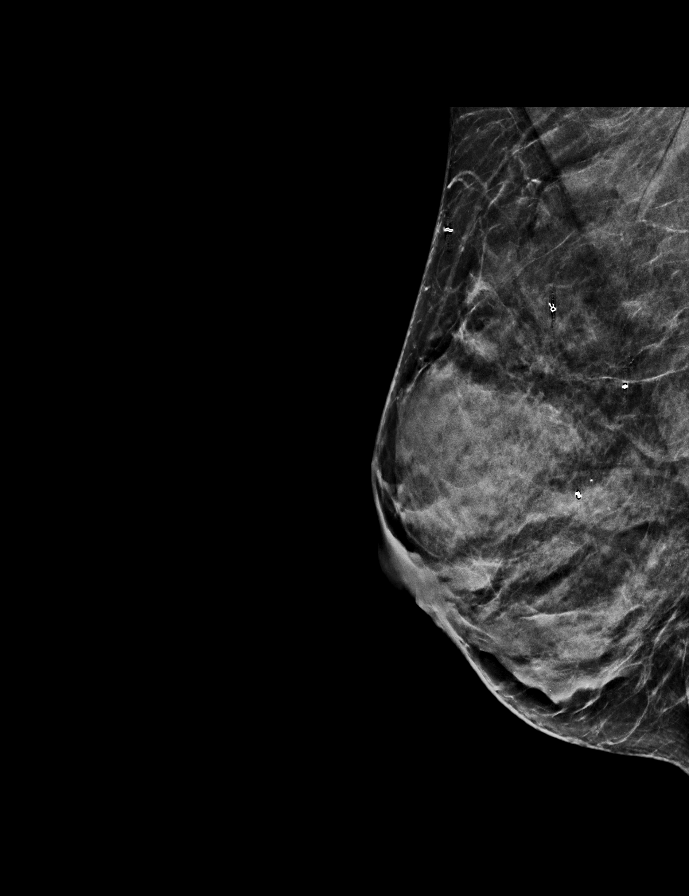

[L CC synth-2D]
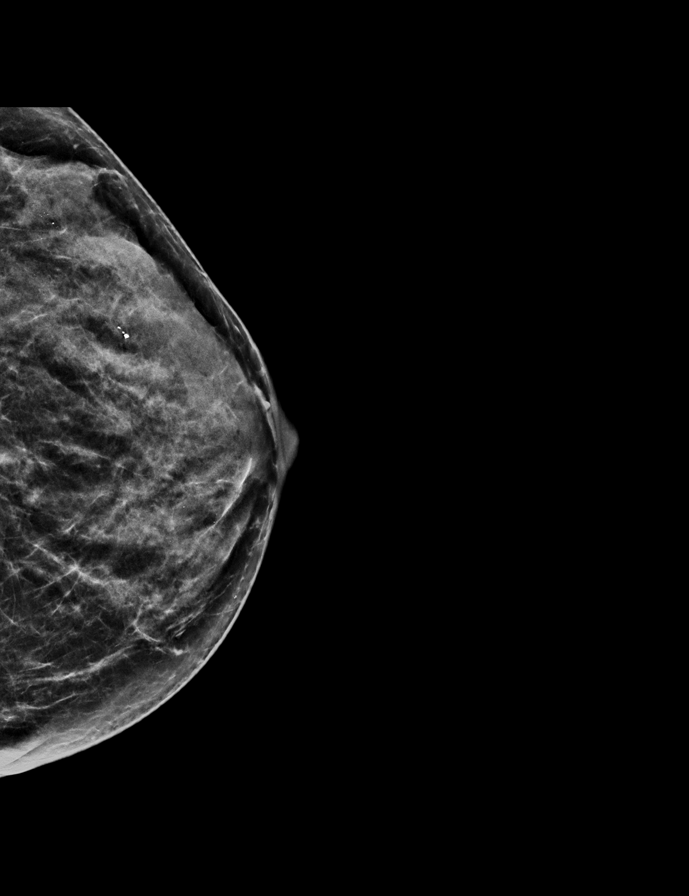

[R CC]
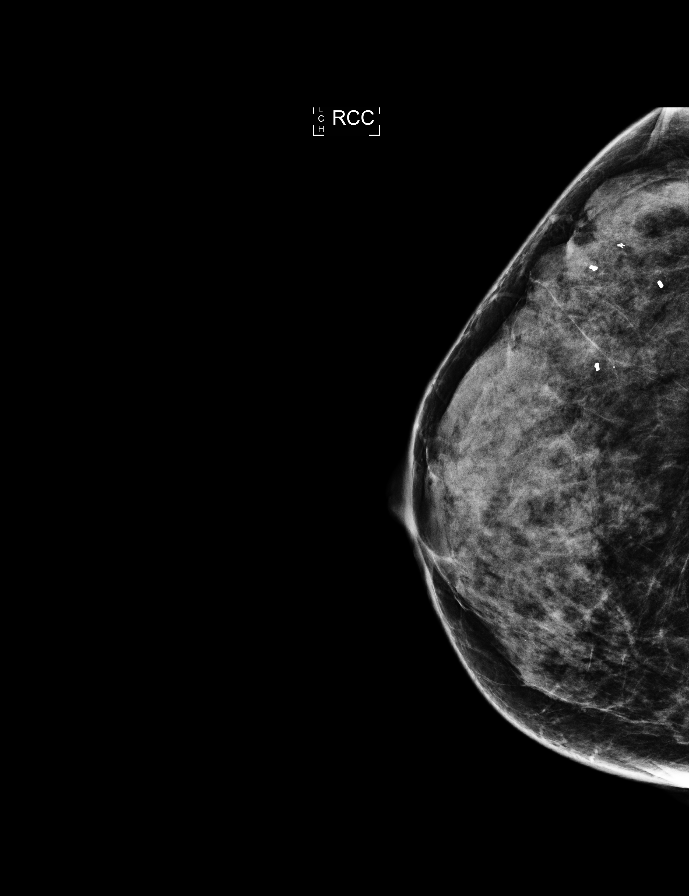

[9 of 29 positions shown; findings below may reference images not displayed]

ACR Breast Density Category d: The breast tissue is extremely dense,
which lowers the sensitivity of mammography.
FINDINGS: There are no findings suspicious for malignancy. Images were
processed with CAD.
IMPRESSION: No mammographic evidence of malignancy. A result letter of this
screening mammogram will be mailed directly to the patient.

RECOMMENDATION:
Screening mammogram in one year. (Code:US-D-RZ7)

BI-RADS CATEGORY  1: Negative.

## 2017-11-07 ENCOUNTER — Other Ambulatory Visit: Payer: Self-pay | Admitting: Obstetrics and Gynecology

## 2018-01-12 ENCOUNTER — Other Ambulatory Visit: Payer: Self-pay | Admitting: Obstetrics and Gynecology

## 2018-02-27 ENCOUNTER — Other Ambulatory Visit: Payer: Self-pay | Admitting: Obstetrics and Gynecology

## 2018-03-08 ENCOUNTER — Encounter: Payer: Self-pay | Admitting: Obstetrics and Gynecology

## 2018-03-08 ENCOUNTER — Ambulatory Visit (INDEPENDENT_AMBULATORY_CARE_PROVIDER_SITE_OTHER): Payer: BLUE CROSS/BLUE SHIELD | Admitting: Obstetrics and Gynecology

## 2018-03-08 VITALS — BP 98/65 | HR 73 | Ht 65.0 in | Wt 126.5 lb

## 2018-03-08 DIAGNOSIS — E538 Deficiency of other specified B group vitamins: Secondary | ICD-10-CM | POA: Diagnosis not present

## 2018-03-08 DIAGNOSIS — Z01419 Encounter for gynecological examination (general) (routine) without abnormal findings: Secondary | ICD-10-CM | POA: Diagnosis not present

## 2018-03-08 MED ORDER — VALACYCLOVIR HCL 1 G PO TABS: 1000.0000 mg | ORAL_TABLET | Freq: Two times a day (BID) | ORAL | 3 refills | Status: DC

## 2018-03-08 MED ORDER — MIMVEY LO 0.5-0.1 MG PO TABS: 1.0000 | ORAL_TABLET | Freq: Every day | ORAL | 11 refills | Status: DC

## 2018-03-08 MED ORDER — CYANOCOBALAMIN 1000 MCG/ML IJ SOLN: 1000.0000 ug | INTRAMUSCULAR | 1 refills | Status: DC

## 2018-03-08 MED ORDER — PICATO 0.05 % EX GEL: CUTANEOUS | 4 refills | Status: DC

## 2018-03-08 NOTE — Progress Notes (Signed)
Subjective:   Sylvia Daugherty is a 53 y.o. G2P0000 Caucasian female here for a routine well-woman exam.  No LMP recorded. Patient is postmenopausal.    Current complaints: none, needs refills on meds PCP: none       does desire labs  Social History: Sexual: heterosexual Marital Status: married Living situation: with spouse Occupation: Multimedia programmer  Tobacco/alcohol: no tobacco use Illicit drugs: no history of illicit drug use  The following portions of the patient's history were reviewed and updated as appropriate: allergies, current medications, past family history, past medical history, past social history, past surgical history and problem list.  Past Medical History Past Medical History:  Diagnosis Date  . Diffuse cystic mastopathy 2011   RIGHT   . 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 BREAST BIOPSY Right 03-26-14   FIBROCYSTIC CHANGES WITH CALC. and PASH  . BREAST BIOPSY Right 2012   core with clip  . BREAST BIOPSY Right 2014   core with clip  . COLONOSCOPY  2013   DR.ELLIOTT  . COLONOSCOPY WITH PROPOFOL N/A 03/10/2016   Procedure: COLONOSCOPY WITH PROPOFOL;  Surgeon: Lucilla Lame, MD;  Location: [facility];  Service: Endoscopy;  Laterality: N/A;  . POLYPECTOMY  03/10/2016   Procedure: POLYPECTOMY;  Surgeon: Lucilla Lame, MD;  Location: [facility] [place];  Service: Endoscopy;;  . [place]    Gynecologic History G3P0000  No LMP recorded. Patient is postmenopausal. Contraception: post menopausal status and tubal ligation Last Pap: 2018. Results were: normal Last mammogram: 03/2017. Results were: normal   Obstetric History OB History  Gravida Para Term Preterm AB Living  3 3 0 0 0    SAB TAB Ectopic Multiple Live Births  0 0 0        # Outcome Date GA Lbr Len/2nd Weight Sex Delivery Anes PTL Lv  3 Para           2 Para           1 Para             Obstetric  Comments  FIRST PREGNANCY 16  FIRST MENSTRUAL 10    Current Medications Current Outpatient Medications on File Prior to Visit  Medication Sig Dispense Refill  . cyanocobalamin (,VITAMIN B-12,) 1000 MCG/ML injection Inject 1 mL (1,000 mcg total) into the muscle every 30 (thirty) days. 10 mL 1  . Melatonin 5 MG TABS Take 2 tablets (10 mg total) by mouth at bedtime. 60 tablet 3  . MIMVEY LO 0.5-0.1 MG tablet Take 1 tablet by mouth daily. 28 tablet 11  . valACYclovir (VALTREX) 1000 MG tablet Take 1 tablet (1,000 mg total) by mouth 2 (two) times daily. 60 tablet 3  . Estradiol-Norethindrone Acet 0.5-0.1 MG tablet TAKE 1 TABLET BY MOUTH DAILY. (Patient not taking: Reported on 03/08/2018) 84 tablet 1  . PICATO 0.05 % GEL APPLY ON THE SKIN AT BEDTIME APPLY TO CHEST.  1  . valACYclovir (VALTREX) 1000 MG tablet TAKE 1 TABLET BY MOUTH TWICE A DAY (Patient not taking: Reported on 03/08/2018) 60 tablet 3   No current facility-administered medications on file prior to visit.     Review of Systems Patient denies any headaches, blurred vision, shortness of breath, chest pain, abdominal pain, problems with bowel movements, urination, or intercourse.  Objective:  BP 98/65   Pulse 73   Ht 5\' 5"  (1.651 m)  Wt 126 lb 8 oz (57.4 kg)   BMI 21.05 kg/m  Physical Exam  General:  Well developed, well nourished, no acute distress. She is alert and oriented x3. Skin:  Warm and dry Neck:  Midline trachea, no thyromegaly or nodules Cardiovascular: Regular rate and rhythm, no murmur heard Lungs:  Effort normal, all lung fields clear to auscultation bilaterally Breasts:  No dominant palpable mass, retraction, or nipple discharge, fibrocystic tissue noted bilaterally Abdomen:  Soft, non tender, no hepatosplenomegaly or masses Pelvic:  External genitalia is normal in appearance.  The vagina is normal in appearance. The cervix is bulbous, no CMT.  Thin prep pap is not done. Uterus is felt to be normal size, shape,  and contour.  No adnexal masses or tenderness noted. Extremities:  No swelling or varicosities noted Psych:  She has a normal mood and affect  Assessment:   Healthy well-woman exam  Plan:  Labs obtained- will follow up accordingly F/U 1 year for AE, or sooner if needed Mammogram ordered  Delroy Ordway Rockney Ghee, CNM

## 2018-03-09 LAB — COMPREHENSIVE METABOLIC PANEL
A/G RATIO: 2.3 — AB (ref 1.2–2.2)
ALBUMIN: 4.9 g/dL (ref 3.8–4.9)
ALT: 18 IU/L (ref 0–32)
AST: 16 IU/L (ref 0–40)
Alkaline Phosphatase: 52 IU/L (ref 39–117)
BILIRUBIN TOTAL: 0.5 mg/dL (ref 0.0–1.2)
BUN / CREAT RATIO: 13 (ref 9–23)
BUN: 14 mg/dL (ref 6–24)
CHLORIDE: 100 mmol/L (ref 96–106)
CO2: 25 mmol/L (ref 20–29)
Calcium: 9.9 mg/dL (ref 8.7–10.2)
Creatinine, Ser: 1.06 mg/dL — ABNORMAL HIGH (ref 0.57–1.00)
GFR calc non Af Amer: 60 mL/min/{1.73_m2} (ref >59)
GFR, EST AFRICAN AMERICAN: 69 mL/min/{1.73_m2} (ref >59)
Globulin, Total: 2.1 g/dL (ref 1.5–4.5)
Glucose: 80 mg/dL (ref 65–99)
POTASSIUM: 4.5 mmol/L (ref 3.5–5.2)
Sodium: 141 mmol/L (ref 134–144)
TOTAL PROTEIN: 7 g/dL (ref 6.0–8.5)

## 2018-03-09 LAB — B12 AND FOLATE PANEL
FOLATE: 7.2 ng/mL (ref >3.0)
Vitamin B-12: 459 pg/mL (ref 232–1245)

## 2018-03-09 LAB — LIPID PANEL
CHOLESTEROL TOTAL: 202 mg/dL — AB (ref 100–199)
Chol/HDL Ratio: 2.8 ratio (ref 0.0–4.4)
HDL: 73 mg/dL (ref >39)
LDL Calculated: 117 mg/dL — ABNORMAL HIGH (ref 0–99)
TRIGLYCERIDES: 62 mg/dL (ref 0–149)
VLDL Cholesterol Cal: 12 mg/dL (ref 5–40)

## 2018-03-09 LAB — VITAMIN D 25 HYDROXY (VIT D DEFICIENCY, FRACTURES): Vit D, 25-Hydroxy: 39.7 ng/mL (ref 30.0–100.0)

## 2018-03-21 ENCOUNTER — Ambulatory Visit
Admission: RE | Admit: 2018-03-21 | Discharge: 2018-03-21 | Disposition: A | Discharge status: Home / Self Care | Payer: BLUE CROSS/BLUE SHIELD | Source: Ambulatory Visit | Attending: Obstetrics and Gynecology | Admitting: Obstetrics and Gynecology

## 2018-03-21 ENCOUNTER — Other Ambulatory Visit: Payer: Self-pay

## 2018-03-21 DIAGNOSIS — Z1231 Encounter for screening mammogram for malignant neoplasm of breast: Secondary | ICD-10-CM | POA: Diagnosis not present

## 2018-03-21 DIAGNOSIS — Z01419 Encounter for gynecological examination (general) (routine) without abnormal findings: Secondary | ICD-10-CM

## 2018-05-21 ENCOUNTER — Other Ambulatory Visit: Payer: Self-pay | Admitting: Certified Nurse Midwife

## 2018-09-19 ENCOUNTER — Other Ambulatory Visit: Payer: Self-pay | Admitting: Obstetrics and Gynecology

## 2018-10-04 ENCOUNTER — Other Ambulatory Visit: Payer: Self-pay

## 2018-10-04 DIAGNOSIS — Z20822 Contact with and (suspected) exposure to covid-19: Secondary | ICD-10-CM

## 2018-10-05 LAB — NOVEL CORONAVIRUS, NAA: SARS-CoV-2, NAA: NOT DETECTED

## 2019-02-25 DIAGNOSIS — Z85828 Personal history of other malignant neoplasm of skin: Secondary | ICD-10-CM

## 2019-02-25 HISTORY — DX: Personal history of other malignant neoplasm of skin: Z85.828

## 2019-03-07 ENCOUNTER — Other Ambulatory Visit: Payer: Self-pay

## 2019-03-07 MED ORDER — ESTRADIOL-NORETHINDRONE ACET 0.5-0.1 MG PO TABS: 1.0000 | ORAL_TABLET | Freq: Every day | ORAL | 2 refills | Status: DC

## 2019-03-07 NOTE — Telephone Encounter (Signed)
Refill on estradiol sent to preferred pharmacy per faxed request from [facility]. Patient has appointment scheduled for 03/11/19 with Philip Aspen CNM.

## 2019-03-11 ENCOUNTER — Encounter: Payer: Self-pay | Admitting: Certified Nurse Midwife

## 2019-03-11 ENCOUNTER — Ambulatory Visit (INDEPENDENT_AMBULATORY_CARE_PROVIDER_SITE_OTHER): Payer: BC Managed Care – PPO | Admitting: Certified Nurse Midwife

## 2019-03-11 ENCOUNTER — Other Ambulatory Visit: Payer: Self-pay

## 2019-03-11 VITALS — BP 111/64 | HR 81 | Ht 65.0 in | Wt 131.6 lb

## 2019-03-11 DIAGNOSIS — Z23 Encounter for immunization: Secondary | ICD-10-CM | POA: Diagnosis not present

## 2019-03-11 DIAGNOSIS — Z01419 Encounter for gynecological examination (general) (routine) without abnormal findings: Secondary | ICD-10-CM

## 2019-03-11 MED ORDER — TETANUS-DIPHTH-ACELL PERTUSSIS 5-2.5-18.5 LF-MCG/0.5 IM SUSP
0.5000 mL | Freq: Once | INTRAMUSCULAR | Status: AC
  Administered 2019-03-11: 15:00:00 0.5 mL via INTRAMUSCULAR

## 2019-03-11 MED ORDER — MIMVEY LO 0.5-0.1 MG PO TABS: 1.0000 | ORAL_TABLET | Freq: Every day | ORAL | 11 refills | Status: DC

## 2019-03-11 MED ORDER — VALACYCLOVIR HCL 1 G PO TABS: 1000.0000 mg | ORAL_TABLET | Freq: Two times a day (BID) | ORAL | 3 refills | Status: DC

## 2019-03-11 MED ORDER — CYANOCOBALAMIN 1000 MCG/ML IJ SOLN: 1000.0000 ug | INTRAMUSCULAR | 1 refills | Status: DC

## 2019-03-11 NOTE — Progress Notes (Signed)
GYNECOLOGY ANNUAL PREVENTATIVE CARE ENCOUNTER NOTE  History:     DARYAN MICHALEC is a 54 y.o. G60P0000 female here for a routine annual gynecologic exam.  Current complaints: vaginal dryness difficultly sleeping    Denies abnormal vaginal bleeding, discharge, pelvic pain, problems with intercourse or other gynecologic concerns.     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 Patient is postmenopausal. Contraception: tubal ligation Last Pap: 02/17/2016 Results were: normal with negative HPV Last mammogram: 04/06/18. Results were: normal  Obstetric History OB History  Gravida Para Term Preterm AB Living  3 3 0 0 0    SAB TAB Ectopic Multiple Live Births  0 0 0        # Outcome Date GA Lbr Len/2nd Weight Sex Delivery Anes PTL Lv  3 Para           2 Para           1 Para             Obstetric Comments  FIRST PREGNANCY 16  FIRST MENSTRUAL 10    Past Medical History:  Diagnosis Date  . Diffuse cystic mastopathy 2011   RIGHT   . 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 BREAST BIOPSY Right 03-26-14   FIBROCYSTIC CHANGES WITH CALC. and PASH  . BREAST BIOPSY Right 2012   core with clip-neg  . BREAST BIOPSY Right 2014   core with clip-neg  . COLONOSCOPY  2013   DR.ELLIOTT  . COLONOSCOPY WITH PROPOFOL N/A 03/10/2016   Procedure: COLONOSCOPY WITH PROPOFOL;  Surgeon: Lucilla Lame, MD;  Location: [place];  Service: Endoscopy;  Laterality: N/A;  . POLYPECTOMY  03/10/2016   Procedure: POLYPECTOMY;  Surgeon: Lucilla Lame, MD;  Location: [place];  Service: Endoscopy;;  . TUBAL LIGATION  1992    Current Outpatient Medications on File Prior to Visit  Medication Sig Dispense Refill  . cyanocobalamin (,VITAMIN B-12,) 1000 MCG/ML injection  Inject 1 mL (1,000 mcg total) into the muscle every 30 (thirty) days. 10 mL 1  . Melatonin 5 MG TABS Take 2 tablets (10 mg total) by mouth at bedtime. 60 tablet 3  . MIMVEY LO 0.5-0.1 MG tablet Take 1 tablet by mouth daily. 28 tablet 11  . PICATO 0.05 % GEL APPLY ON THE SKIN AT BEDTIME APPLY TO CHEST. 2 each 4  . valACYclovir (VALTREX) 1000 MG tablet Take 1 tablet (1,000 mg total) by mouth 2 (two) times daily. 60 tablet 3  . Estradiol-Norethindrone Acet 0.5-0.1 MG tablet Take 1 tablet by mouth daily. (Patient not taking: Reported on 03/11/2019) 84 tablet 2  . valACYclovir (VALTREX) 1000 MG tablet TAKE 1 TABLET BY MOUTH TWICE A DAY (Patient not taking: Reported on 03/08/2018) 60 tablet 3   No current facility-administered medications on file prior to visit.    No Known Allergies  Social History:  reports that she has never smoked. She has never used smokeless tobacco. She reports current alcohol use. She reports that she does not use drugs.  Family History  Problem Relation Age of Onset  . Diabetes Father   . Breast cancer Neg Hx     The following portions of the patient's history were reviewed and updated as appropriate: allergies, current  medications, past family history, past medical history, past social history, past surgical history and problem list.  Review of Systems Pertinent items noted in HPI and remainder of comprehensive ROS otherwise negative.  Physical Exam:  BP 111/64   Pulse 81   Ht 5\' 5"  (1.651 m)   Wt 131 lb 9 oz (59.7 kg)   BMI 21.89 kg/m  CONSTITUTIONAL: Well-developed, well-nourished female in no acute distress.  HENT:  Normocephalic, atraumatic, External right and left ear normal. Oropharynx is clear and moist EYES: Conjunctivae and EOM are normal. Pupils are equal, round, and reactive to light. No scleral icterus.  NECK: Normal range of motion, supple, no masses.  Normal thyroid.  SKIN: Skin is warm and dry. No rash noted. Not diaphoretic. No erythema. No  pallor. MUSCULOSKELETAL: Normal range of motion. No tenderness.  No cyanosis, clubbing, or edema.  2+ distal pulses. NEUROLOGIC: Alert and oriented to person, place, and time. Normal reflexes, muscle tone coordination.  PSYCHIATRIC: Normal mood and affect. Normal behavior. Normal judgment and thought content. CARDIOVASCULAR: Normal heart rate noted, regular rhythm RESPIRATORY: Clear to auscultation bilaterally. Effort and breath sounds normal, no problems with respiration noted. BREASTS: Symmetric in size. No masses, tenderness, skin changes, nipple drainage, or lymphadenopathy bilaterally. ABDOMEN: Soft, no distention noted.  No tenderness, rebound or guarding.  PELVIC: Normal appearing external genitalia and urethral meatus; normal appearing vaginal mucosa and cervix.  No abnormal discharge noted.  Pap smear not indicated  Normal uterine size, no other palpable masses, no uterine or adnexal tenderness.   Assessment and Plan:    1. Women's annual routine gynecological examination  Pap smear not indicated Mammogram scheduled Labs none due Refills: mimvey, valtrex, vitamin B Discussed use of vaginal moisturizer and use of local estrogen. Information given. Encouraged use of benadryl, melatonin or Unisom for sleep. Self help measures reviewed.  Routine preventative health maintenance measures emphasized. Please refer to After Visit Summary for other counseling recommendations.      Philip Aspen, CNM

## 2019-03-11 NOTE — Patient Instructions (Signed)

## 2019-03-18 ENCOUNTER — Other Ambulatory Visit: Payer: Self-pay | Admitting: Certified Nurse Midwife

## 2019-03-18 DIAGNOSIS — Z1231 Encounter for screening mammogram for malignant neoplasm of breast: Secondary | ICD-10-CM

## 2019-04-02 ENCOUNTER — Other Ambulatory Visit: Payer: Self-pay | Admitting: Certified Nurse Midwife

## 2019-04-02 ENCOUNTER — Ambulatory Visit
Admission: RE | Admit: 2019-04-02 | Discharge: 2019-04-02 | Disposition: A | Discharge status: Home / Self Care | Payer: BC Managed Care – PPO | Source: Ambulatory Visit | Attending: Certified Nurse Midwife | Admitting: Certified Nurse Midwife

## 2019-04-02 DIAGNOSIS — R921 Mammographic calcification found on diagnostic imaging of breast: Secondary | ICD-10-CM

## 2019-04-02 DIAGNOSIS — Z1231 Encounter for screening mammogram for malignant neoplasm of breast: Secondary | ICD-10-CM

## 2019-04-02 DIAGNOSIS — R928 Other abnormal and inconclusive findings on diagnostic imaging of breast: Secondary | ICD-10-CM

## 2019-04-18 ENCOUNTER — Ambulatory Visit
Admission: RE | Admit: 2019-04-18 | Discharge: 2019-04-18 | Disposition: A | Discharge status: Home / Self Care | Payer: BC Managed Care – PPO | Source: Ambulatory Visit | Attending: Certified Nurse Midwife | Admitting: Certified Nurse Midwife

## 2019-04-18 DIAGNOSIS — R928 Other abnormal and inconclusive findings on diagnostic imaging of breast: Secondary | ICD-10-CM | POA: Diagnosis not present

## 2019-04-18 DIAGNOSIS — R921 Mammographic calcification found on diagnostic imaging of breast: Secondary | ICD-10-CM | POA: Insufficient documentation

## 2019-08-26 ENCOUNTER — Ambulatory Visit: Payer: Self-pay | Admitting: Dermatology

## 2019-11-25 ENCOUNTER — Other Ambulatory Visit: Payer: Self-pay | Admitting: Certified Nurse Midwife

## 2020-01-10 DEATH — deceased

## 2020-01-28 ENCOUNTER — Encounter: Payer: Self-pay | Admitting: Dermatology

## 2020-01-28 ENCOUNTER — Other Ambulatory Visit: Payer: Self-pay

## 2020-01-28 ENCOUNTER — Ambulatory Visit (INDEPENDENT_AMBULATORY_CARE_PROVIDER_SITE_OTHER): Payer: BC Managed Care – PPO | Admitting: Dermatology

## 2020-01-28 DIAGNOSIS — L723 Sebaceous cyst: Secondary | ICD-10-CM | POA: Diagnosis not present

## 2020-01-28 DIAGNOSIS — Z85828 Personal history of other malignant neoplasm of skin: Secondary | ICD-10-CM | POA: Diagnosis not present

## 2020-01-28 DIAGNOSIS — L578 Other skin changes due to chronic exposure to nonionizing radiation: Secondary | ICD-10-CM | POA: Diagnosis not present

## 2020-01-28 DIAGNOSIS — L820 Inflamed seborrheic keratosis: Secondary | ICD-10-CM

## 2020-01-28 MED ORDER — DOXYCYCLINE HYCLATE 100 MG PO CAPS: 100.0000 mg | ORAL_CAPSULE | Freq: Two times a day (BID) | ORAL | 0 refills | Status: DC

## 2020-01-28 NOTE — Progress Notes (Signed)
Follow-Up Visit   Subjective  Sylvia Daugherty is a 55 y.o. female who presents for the following: growth (Right abdomen x 3 months, has grown, red, and sore in the past 5 days. No drainage. She has a cyst removed in this area in 2018./She also has a growth on the right wrist x 1 month that has bled. She has a history of BCC ).  The following portions of the chart were reviewed this encounter and updated as appropriate:       Review of Systems:  No other skin or systemic complaints except as noted in HPI or Assessment and Plan.  Objective  Well appearing patient in no apparent distress; mood and affect are within normal limits.  A focused examination was performed including face, abdomen, extremities. Relevant physical exam findings are noted in the Assessment and Plan.  Objective  Right Upper Abdomen: Sq nodule with erythema 1.5cm.  Objective  Right Wrist  x 1: Erythematous keratotic or waxy stuck-on papule   Assessment & Plan    Actinic Damage - chronic, secondary to cumulative UV radiation exposure/sun exposure over time - diffuse scaly erythematous macules with underlying dyspigmentation - Recommend daily broad spectrum sunscreen SPF 30+ to sun-exposed areas, reapply every 2 hours as needed.  - Call for new or changing lesions.  History of Basal Cell Carcinoma of the Skin - No evidence of recurrence today of the left medial canthus. She has follow-up with Dr Lacinda Axon in 1 month for scar. - Recommend regular full body skin exams - Recommend daily broad spectrum sunscreen SPF 30+ to sun-exposed areas, reapply every 2 hours as needed.  - Call if any new or changing lesions are noted between office visits   Inflamed epidermoid cyst of skin Right Upper Abdomen  Start doxycycline 100mg  take 1 po BID x 2 weeks, then decrease to QD until gone dsp #60 0Rf.  Doxycycline should be taken with food to prevent nausea. Do not lay down for 30 minutes after taking. Be cautious with sun  exposure and use good sun protection while on this medication. Pregnant women should not take this medication.    Incision and Drainage - Right Upper Abdomen Location: right upper abdomen  Informed Consent: Discussed risks (permanent scarring, light or dark discoloration, infection, pain, bleeding, bruising, redness, damage to adjacent structures, and recurrence of the lesion) and benefits of the procedure, as well as the alternatives.  Informed consent was obtained.  Preparation: The area was prepped with alcohol.  Anesthesia: Lidocaine 1% with epinephrine  Procedure Details: An incision was made overlying the lesion. The lesion drained pus, blood, keratin  Antibiotic ointment and a sterile pressure dressing were applied. The patient tolerated procedure well.  Total number of lesions drained: 1  Plan: The patient was instructed on post-op care. Recommend OTC analgesia as needed for pain.   doxycycline (VIBRAMYCIN) 100 MG capsule - Right Upper Abdomen  Inflamed seborrheic keratosis Right Wrist  x 1  Destruction of lesion - Right Wrist  x 1  Destruction method: cryotherapy   Informed consent: discussed and consent obtained   Lesion destroyed using liquid nitrogen: Yes   Region frozen until ice ball extended beyond lesion: Yes   Outcome: patient tolerated procedure well with no complications   Post-procedure details: wound care instructions given    Return as scheduled.Lindi Adie, CMA, am acting as scribe for [name], MD .  Documentation: I have reviewed the above documentation for accuracy and completeness, and  I agree with the above.  [name] MD

## 2020-01-28 NOTE — Patient Instructions (Addendum)
Cryotherapy Aftercare  . Wash gently with soap and water everyday.   Marland Kitchen Apply Vaseline and Band-Aid daily until healed.   Start doxycycline 100mg  take 1 capsule by mouth twice daily with food x 2 weeks, then decrease to once a day until gone.  Doxycycline should be taken with food to prevent nausea. Do not lay down for 30 minutes after taking. Be cautious with sun exposure and use good sun protection while on this medication. Pregnant women should not take this medication.   Wound Care Instructions  1. Cleanse wound gently with soap and water once a day then pat dry with clean gauze. Apply a thing coat of Petrolatum (petroleum jelly, "Vaseline") over the wound (unless you have an allergy to this). We recommend that you use a new, sterile tube of Vaseline. Do not pick or remove scabs. Do not remove the yellow or white "healing tissue" from the base of the wound.  2. Cover the wound with fresh, clean, nonstick gauze and secure with paper tape. You may use Band-Aids in place of gauze and tape if the would is small enough, but would recommend trimming much of the tape off as there is often too much. Sometimes Band-Aids can irritate the skin.  3. If you experience any problems, such as abnormal amounts of bleeding, swelling, significant bruising, significant pain, or evidence of infection, please call the office immediately.

## 2020-02-19 ENCOUNTER — Other Ambulatory Visit: Payer: Self-pay | Admitting: Dermatology

## 2020-02-19 DIAGNOSIS — L723 Sebaceous cyst: Secondary | ICD-10-CM

## 2020-03-10 ENCOUNTER — Other Ambulatory Visit (HOSPITAL_COMMUNITY)
Admission: RE | Admit: 2020-03-10 | Discharge: 2020-03-10 | Disposition: A | Discharge status: Home / Self Care | Payer: BC Managed Care – PPO | Source: Ambulatory Visit | Attending: Certified Nurse Midwife | Admitting: Certified Nurse Midwife

## 2020-03-10 ENCOUNTER — Encounter: Payer: Self-pay | Admitting: Certified Nurse Midwife

## 2020-03-10 ENCOUNTER — Other Ambulatory Visit: Payer: Self-pay

## 2020-03-10 ENCOUNTER — Ambulatory Visit (INDEPENDENT_AMBULATORY_CARE_PROVIDER_SITE_OTHER): Payer: BC Managed Care – PPO | Admitting: Certified Nurse Midwife

## 2020-03-10 VITALS — BP 105/61 | HR 69 | Ht 63.0 in | Wt 134.1 lb

## 2020-03-10 DIAGNOSIS — Z01419 Encounter for gynecological examination (general) (routine) without abnormal findings: Secondary | ICD-10-CM | POA: Diagnosis not present

## 2020-03-10 DIAGNOSIS — Z1231 Encounter for screening mammogram for malignant neoplasm of breast: Secondary | ICD-10-CM | POA: Diagnosis not present

## 2020-03-10 DIAGNOSIS — N760 Acute vaginitis: Secondary | ICD-10-CM | POA: Diagnosis not present

## 2020-03-10 DIAGNOSIS — B9689 Other specified bacterial agents as the cause of diseases classified elsewhere: Secondary | ICD-10-CM | POA: Insufficient documentation

## 2020-03-10 DIAGNOSIS — N898 Other specified noninflammatory disorders of vagina: Secondary | ICD-10-CM | POA: Diagnosis present

## 2020-03-10 MED ORDER — VALACYCLOVIR HCL 1 G PO TABS: 1000.0000 mg | ORAL_TABLET | Freq: Two times a day (BID) | ORAL | 3 refills | Status: DC

## 2020-03-10 MED ORDER — CYANOCOBALAMIN 1000 MCG/ML IJ SOLN: 1000.0000 ug | INTRAMUSCULAR | 11 refills | Status: DC

## 2020-03-10 NOTE — Progress Notes (Signed)
GYNECOLOGY ANNUAL PREVENTATIVE CARE ENCOUNTER NOTE  History:     Sylvia Daugherty is a 55 y.o. G86P0000 female here for a routine annual gynecologic exam.  Current complaints: vaginal itching and odor for the past few weeks.   Denies abnormal vaginal bleeding, discharge, pelvic pain, problems with intercourse or other gynecologic concerns.     Social Relationship:Married  Living: with spouse Work: Cabin crew at [employer]  Exercise: few x wk  Smoke/Alcohol/drug use: occ alcohol, denies drugs and smoking  Gynecologic History No LMP recorded. Patient is postmenopausal. Contraception: post menopausal status and tubal ligation Last Pap: 02/17/2016. Results were: normal with negative HPV Last mammogram: 04/18/2019. Results were: normal  Obstetric History OB History  Gravida Para Term Preterm AB Living  3 3 0 0 0    SAB IAB Ectopic Multiple Live Births  0 0 0        # Outcome Date GA Lbr Len/2nd Weight Sex Delivery Anes PTL Lv  3 Para           2 Para           1 Para             Obstetric Comments  FIRST PREGNANCY 16  FIRST MENSTRUAL 10    Past Medical History:  Diagnosis Date  . Basal cell carcinoma 02/13/2017   L medial canthus  . Diffuse cystic mastopathy 2011   RIGHT   . 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 BREAST BIOPSY Right 03-26-14   FIBROCYSTIC CHANGES WITH CALC. and PASH  . BREAST BIOPSY Right 2012   core with clip-neg  . BREAST BIOPSY Right 2014   core with clip-neg  . COLONOSCOPY  2013   DR.ELLIOTT  . COLONOSCOPY WITH PROPOFOL N/A 03/10/2016   Procedure: COLONOSCOPY WITH PROPOFOL;  Surgeon: Lucilla Lame, MD;  Location: [place];  Service: Endoscopy;  Laterality: N/A;  . POLYPECTOMY  03/10/2016   Procedure: POLYPECTOMY;  Surgeon: Lucilla Lame, MD;  Location: [place];  Service: Endoscopy;;  . TUBAL LIGATION  1992    Current Outpatient Medications on File Prior to Visit   Medication Sig Dispense Refill  . doxycycline (VIBRAMYCIN) 100 MG capsule TAKE 1 CAPSULE (100 MG TOTAL) BY MOUTH 2 (TWO) TIMES DAILY. TAKE WITH FOOD. 60 capsule 0  . Estradiol-Norethindrone Acet 0.5-0.1 MG tablet TAKE 1 TABLET BY MOUTH EVERY DAY 84 tablet 0  . Melatonin 5 MG TABS Take 2 tablets (10 mg total) by mouth at bedtime. 60 tablet 3  . MIMVEY LO 0.5-0.1 MG tablet Take 1 tablet by mouth daily. 28 tablet 11  . PICATO 0.05 % GEL APPLY ON THE SKIN AT BEDTIME APPLY TO CHEST. 2 each 4   No current facility-administered medications on file prior to visit.    No Known Allergies  Social History:  reports that she has never smoked. She has never used smokeless tobacco. She reports current alcohol use. She reports that she does not use drugs.  Family History  Problem Relation Age of Onset  . Diabetes Father   . Breast cancer Neg Hx     The following portions of the patient's history were reviewed and updated as appropriate: allergies, current medications, past family history, past medical history, past social history, past surgical history and problem list.  Review of Systems Pertinent items noted in HPI and remainder of comprehensive ROS otherwise negative.  Physical Exam:  BP 105/61   Pulse 69   Ht 5\' 3"  (1.6 m)   Wt 134 lb 1.6 oz (60.8 kg)   BMI 23.75 kg/m  CONSTITUTIONAL: Well-developed, well-nourished female in no acute distress.  HENT:  Normocephalic, atraumatic, External right and left ear normal. Oropharynx is clear and moist EYES: Conjunctivae and EOM are normal. Pupils are equal, round, and reactive to light. No scleral icterus.  NECK: Normal range of motion, supple, no masses.  Normal thyroid.  SKIN: Skin is warm and dry. No rash noted. Not diaphoretic. No erythema. No pallor. MUSCULOSKELETAL: Normal range of motion. No tenderness.  No cyanosis, clubbing, or edema.  2+ distal pulses. NEUROLOGIC: Alert and oriented to person, place, and time. Normal reflexes, muscle  tone coordination.  PSYCHIATRIC: Normal mood and affect. Normal behavior. Normal judgment and thought content. CARDIOVASCULAR: Normal heart rate noted, regular rhythm RESPIRATORY: Clear to auscultation bilaterally. Effort and breath sounds normal, no problems with respiration noted. BREASTS: Symmetric in size. No masses, tenderness, skin changes, nipple drainage, or lymphadenopathy bilaterally.  ABDOMEN: Soft, no distention noted.  No tenderness, rebound or guarding.  PELVIC: Normal appearing external genitalia and urethral meatus; normal appearing vaginal mucosa mild redness and cervix.discharge yellowish in color, no odor noted.  Pap smear not due.  Normal uterine size, no other palpable masses, no uterine or adnexal tenderness.  .   Assessment and Plan:    1. Women's annual routine gynecological examination  Pap: not due Mammogram : ordered Labs: vaginal swab, declines HIV & Hep C  Refills: vitamin B , mimvey, valtrex.  Referral:none Routine preventative health maintenance measures emphasized. Please refer to After Visit Summary for other counseling recommendations.      Philip Aspen, CNM [facility]

## 2020-03-10 NOTE — Patient Instructions (Signed)
Preventive Care 84-57 Years Old, Female Preventive care refers to lifestyle choices and visits with your health care provider that can promote health and wellness. This includes:  A yearly physical exam. This is also called an annual wellness visit.  Regular dental and eye exams.  Immunizations.  Screening for certain conditions.  Healthy lifestyle choices, such as: ? Eating a healthy diet. ? Getting regular exercise. ? Not using drugs or products that contain nicotine and tobacco. ? Limiting alcohol use. What can I expect for my preventive care visit? Physical exam Your health care provider will check your:  Height and weight. These may be used to calculate your BMI (body mass index). BMI is a measurement that tells if you are at a healthy weight.  Heart rate and blood pressure.  Body temperature.  Skin for abnormal spots. Counseling Your health care provider may ask you questions about your:  Past medical problems.  Family's medical history.  Alcohol, tobacco, and drug use.  Emotional well-being.  Home life and relationship well-being.  Sexual activity.  Diet, exercise, and sleep habits.  Work and work Statistician.  Access to firearms.  Method of birth control.  Menstrual cycle.  Pregnancy history. What immunizations do I need? Vaccines are usually given at various ages, according to a schedule. Your health care provider will recommend vaccines for you based on your age, medical history, and lifestyle or other factors, such as travel or where you work.   What tests do I need? Blood tests  Lipid and cholesterol levels. These may be checked every 5 years, or more often if you are over 3 years old.  Hepatitis C test.  Hepatitis B test. Screening  Lung cancer screening. You may have this screening every year starting at age 73 if you have a 30-pack-year history of smoking and currently smoke or have quit within the past 15 years.  Colorectal cancer  screening. ? All adults should have this screening starting at age 52 and continuing until age 17. ? Your health care provider may recommend screening at age 49 if you are at increased risk. ? You will have tests every 1-10 years, depending on your results and the type of screening test.  Diabetes screening. ? This is done by checking your blood sugar (glucose) after you have not eaten for a while (fasting). ? You may have this done every 1-3 years.  Mammogram. ? This may be done every 1-2 years. ? Talk with your health care provider about when you should start having regular mammograms. This may depend on whether you have a family history of breast cancer.  BRCA-related cancer screening. This may be done if you have a family history of breast, ovarian, tubal, or peritoneal cancers.  Pelvic exam and Pap test. ? This may be done every 3 years starting at age 10. ? Starting at age 11, this may be done every 5 years if you have a Pap test in combination with an HPV test. Other tests  STD (sexually transmitted disease) testing, if you are at risk.  Bone density scan. This is done to screen for osteoporosis. You may have this scan if you are at high risk for osteoporosis. Talk with your health care provider about your test results, treatment options, and if necessary, the need for more tests. Follow these instructions at home: Eating and drinking  Eat a diet that includes fresh fruits and vegetables, whole grains, lean protein, and low-fat dairy products.  Take vitamin and mineral supplements  as recommended by your health care provider.  Do not drink alcohol if: ? Your health care provider tells you not to drink. ? You are pregnant, may be pregnant, or are planning to become pregnant.  If you drink alcohol: ? Limit how much you have to 0-1 drink a day. ? Be aware of how much alcohol is in your drink. In the [place] one drink equals one 12 oz bottle of beer (355 mL), one 5 oz glass of  wine (148 mL), or one 1 oz glass of hard liquor (44 mL).   Lifestyle  Take daily care of your teeth and gums. Brush your teeth every morning and night with fluoride toothpaste. Floss one time each day.  Stay active. Exercise for at least 30 minutes 5 or more days each week.  Do not use any products that contain nicotine or tobacco, such as cigarettes, e-cigarettes, and chewing tobacco. If you need help quitting, ask your health care provider.  Do not use drugs.  If you are sexually active, practice safe sex. Use a condom or other form of protection to prevent STIs (sexually transmitted infections).  If you do not wish to become pregnant, use a form of birth control. If you plan to become pregnant, see your health care provider for a prepregnancy visit.  If told by your health care provider, take low-dose aspirin daily starting at age 50.  Find healthy ways to cope with stress, such as: ? Meditation, yoga, or listening to music. ? Journaling. ? Talking to a trusted person. ? Spending time with friends and family. Safety  Always wear your seat belt while driving or riding in a vehicle.  Do not drive: ? If you have been drinking alcohol. Do not ride with someone who has been drinking. ? When you are tired or distracted. ? While texting.  Wear a helmet and other protective equipment during sports activities.  If you have firearms in your house, make sure you follow all gun safety procedures. What's next?  Visit your health care provider once a year for an annual wellness visit.  Ask your health care provider how often you should have your eyes and teeth checked.  Stay up to date on all vaccines. This information is not intended to replace advice given to you by your health care provider. Make sure you discuss any questions you have with your health care provider. Document Revised: 09/30/2019 Document Reviewed: 09/06/2017 Elsevier Patient Education  2021 [employer]  

## 2020-03-12 LAB — CERVICOVAGINAL ANCILLARY ONLY
Bacterial Vaginitis (gardnerella): POSITIVE — AB
Candida Glabrata: NEGATIVE
Candida Vaginitis: NEGATIVE
Comment: NEGATIVE
Comment: NEGATIVE
Comment: NEGATIVE

## 2020-03-15 ENCOUNTER — Other Ambulatory Visit: Payer: Self-pay | Admitting: Certified Nurse Midwife

## 2020-03-15 MED ORDER — METRONIDAZOLE 500 MG PO TABS: 500.0000 mg | ORAL_TABLET | Freq: Two times a day (BID) | ORAL | 0 refills | Status: AC

## 2020-03-15 NOTE — Progress Notes (Signed)
Vaginal swab positive for BV orders placed for treatment. Pt notified via my chart.   Philip Aspen, CNM

## 2020-03-16 ENCOUNTER — Other Ambulatory Visit: Payer: Self-pay | Admitting: Certified Nurse Midwife

## 2020-03-17 ENCOUNTER — Other Ambulatory Visit: Payer: Self-pay | Admitting: Dermatology

## 2020-03-17 DIAGNOSIS — L723 Sebaceous cyst: Secondary | ICD-10-CM

## 2020-03-26 ENCOUNTER — Other Ambulatory Visit: Payer: Self-pay

## 2020-03-26 MED ORDER — METRONIDAZOLE 500 MG PO TABS
500.0000 mg | ORAL_TABLET | Freq: Two times a day (BID) | ORAL | 0 refills | Status: DC
Start: 2020-03-26 — End: 2021-04-19

## 2020-05-12 ENCOUNTER — Other Ambulatory Visit: Payer: Self-pay

## 2020-05-12 ENCOUNTER — Ambulatory Visit
Admission: RE | Admit: 2020-05-12 | Discharge: 2020-05-12 | Disposition: A | Discharge status: Home / Self Care | Payer: BC Managed Care – PPO | Source: Ambulatory Visit | Attending: Certified Nurse Midwife | Admitting: Certified Nurse Midwife

## 2020-05-12 DIAGNOSIS — Z1231 Encounter for screening mammogram for malignant neoplasm of breast: Secondary | ICD-10-CM | POA: Diagnosis present

## 2020-05-17 ENCOUNTER — Ambulatory Visit: Payer: BC Managed Care – PPO | Admitting: Dermatology

## 2020-10-11 ENCOUNTER — Other Ambulatory Visit: Payer: Self-pay

## 2020-10-11 ENCOUNTER — Ambulatory Visit (INDEPENDENT_AMBULATORY_CARE_PROVIDER_SITE_OTHER): Payer: BC Managed Care – PPO | Admitting: Dermatology

## 2020-10-11 DIAGNOSIS — L84 Corns and callosities: Secondary | ICD-10-CM | POA: Diagnosis not present

## 2020-10-11 DIAGNOSIS — D229 Melanocytic nevi, unspecified: Secondary | ICD-10-CM

## 2020-10-11 DIAGNOSIS — D225 Melanocytic nevi of trunk: Secondary | ICD-10-CM

## 2020-10-11 DIAGNOSIS — Z1283 Encounter for screening for malignant neoplasm of skin: Secondary | ICD-10-CM

## 2020-10-11 DIAGNOSIS — L814 Other melanin hyperpigmentation: Secondary | ICD-10-CM

## 2020-10-11 DIAGNOSIS — L570 Actinic keratosis: Secondary | ICD-10-CM | POA: Diagnosis not present

## 2020-10-11 DIAGNOSIS — L738 Other specified follicular disorders: Secondary | ICD-10-CM

## 2020-10-11 DIAGNOSIS — Z85828 Personal history of other malignant neoplasm of skin: Secondary | ICD-10-CM

## 2020-10-11 DIAGNOSIS — L821 Other seborrheic keratosis: Secondary | ICD-10-CM

## 2020-10-11 DIAGNOSIS — D1801 Hemangioma of skin and subcutaneous tissue: Secondary | ICD-10-CM

## 2020-10-11 DIAGNOSIS — L723 Sebaceous cyst: Secondary | ICD-10-CM

## 2020-10-11 DIAGNOSIS — D2272 Melanocytic nevi of left lower limb, including hip: Secondary | ICD-10-CM

## 2020-10-11 MED ORDER — FLUOROURACIL 5 % EX CREA: TOPICAL_CREAM | Freq: Two times a day (BID) | CUTANEOUS | 1 refills | Status: DC

## 2020-10-11 NOTE — Patient Instructions (Signed)

## 2020-10-11 NOTE — Progress Notes (Signed)
Follow-Up Visit   Subjective  Sylvia Daugherty is a 55 y.o. female who presents for the following: check spots (Nose ~5m, scabs over/L ear, ~23m scabs over/R knee, ~74m, scabs over/R pretibial, brown spot/Chest, ~40m scabs over) and Total body skin exam (Hx of BCC L medial canthus, L nasal root).   The following portions of the chart were reviewed this encounter and updated as appropriate:       Review of Systems:  No other skin or systemic complaints except as noted in HPI or Assessment and Plan.  Objective  Well appearing patient in no apparent distress; mood and affect are within normal limits.  A full examination was performed including scalp, head, eyes, ears, nose, lips, neck, chest, axillae, abdomen, back, buttocks, bilateral upper extremities, bilateral lower extremities, hands, feet, fingers, toes, fingernails, and toenails. All findings within normal limits unless otherwise noted below.  L lower nasal dorusm x 1, L central upper forehead x 1, L antihelix x 1, chest x 9, R upper knee x 1, R ant shoulder x 2 (15) Pink scaly macules   mid sternum 0.6cm speckled brown macule darker edge, stable from previous visit     R foot dorsum base of 2nd toe 6.68mm firm white nodule   L lat foot 3.16mm brown macule  L nasal tip 5.38mm indistinct firm flesh pap with adjacent pore, no scaling or bleeding per pt      Assessment & Plan   Lentigines - Scattered tan macules - Due to sun exposure - Benign-appearing, observe - Recommend daily broad spectrum sunscreen SPF 30+ to sun-exposed areas, reapply every 2 hours as needed. - Call for any changes - back  Seborrheic Keratoses - Stuck-on, waxy, tan-brown papules and/or plaques  - Benign-appearing - Discussed benign etiology and prognosis. - Observe - Call for any changes  Melanocytic Nevi - Tan-brown and/or pink-flesh-colored symmetric macules and papules - Benign appearing on exam today - Observation - Call clinic for  new or changing moles - Recommend daily use of broad spectrum spf 30+ sunscreen to sun-exposed areas.  - back  Hemangiomas - Red papules - Discussed benign nature - Observe - Call for any changes - trunk  Actinic Damage - Severe, confluent actinic changes with pre-cancerous actinic keratoses  - Severe, chronic, not at goal, secondary to cumulative UV radiation exposure over time - diffuse scaly erythematous macules and papules with underlying dyspigmentation - Discussed Prescription "Field Treatment" for Severe, Chronic Confluent Actinic Changes with Pre-Cancerous Actinic Keratoses Field treatment involves treatment of an entire area of skin that has confluent Actinic Changes (Sun/ Ultraviolet light damage) and PreCancerous Actinic Keratoses by method of PhotoDynamic Therapy (PDT) and/or prescription Topical Chemotherapy agents such as 5-fluorouracil, 5-fluorouracil/calcipotriene, and/or imiquimod.  The purpose is to decrease the number of clinically evident and subclinical PreCancerous lesions to prevent progression to development of skin cancer by chemically destroying early precancer changes that may or may not be visible.  It has been shown to reduce the risk of developing skin cancer in the treated area. As a result of treatment, redness, scaling, crusting, and open sores may occur during treatment course. One or more than one of these methods may be used and may have to be used several times to control, suppress and eliminate the PreCancerous changes. Discussed treatment course, expected reaction, and possible side effects. - Recommend daily broad spectrum sunscreen SPF 30+ to sun-exposed areas, reapply every 2 hours as needed.  - Staying in the shade or wearing long  sleeves, sun glasses (UVA+UVB protection) and wide brim hats (4-inch brim around the entire circumference of the hat) are also recommended. - Call for new or changing lesions.  - Start 5-fluorouracil/calcipotriene cream twice a  day for 7-10 days to affected areas including chest. Prescription sent to [facility]. Patient provided with contact information for pharmacy and advised the pharmacy will mail the prescription to their home. Patient provided with handout reviewing treatment course and side effects and advised to call or message [place] on MyChart with any concerns.   Skin cancer screening performed today.  History of Basal Cell Carcinoma of the Skin - No evidence of recurrence today - Recommend regular full body skin exams - Recommend daily broad spectrum sunscreen SPF 30+ to sun-exposed areas, reapply every 2 hours as needed.  - Call if any new or changing lesions are noted between office visits  - L medial canthus, L nasal root  AK (actinic keratosis) (15) L lower nasal dorusm x 1, L central upper forehead x 1, L antihelix x 1, chest x 9, R upper knee x 1, R ant shoulder x 2  Destruction of lesion - L lower nasal dorusm x 1, L central upper forehead x 1, L antihelix x 1, chest x 9, R upper knee x 1, R ant shoulder x 2  Destruction method: cryotherapy   Informed consent: discussed and consent obtained   Lesion destroyed using liquid nitrogen: Yes   Region frozen until ice ball extended beyond lesion: Yes   Outcome: patient tolerated procedure well with no complications   Post-procedure details: wound care instructions given   Additional details:  Prior to procedure, discussed risks of blister formation, small wound, skin dyspigmentation, or rare scar following cryotherapy. Recommend Vaseline ointment to treated areas while healing.   fluorouracil (EFUDEX) 5 % cream - L lower nasal dorusm x 1, L central upper forehead x 1, L antihelix x 1, chest x 9, R upper knee x 1, R ant shoulder x 2 Apply topically 2 (two) times daily. Bid for 7-10 days to chest  Lentigines mid sternum  Benign appearing, Stable, Observe   Callus R foot dorsum base of 2nd toe  Vs Dermatofibroma Benign appearing,  observe  Nevus L lat foot  Benign-appearing.  Observation.  Call clinic for new or changing moles.  Recommend daily use of broad spectrum spf 30+ sunscreen to sun-exposed areas.    Sebaceous hyperplasia L nasal tip  Benign-appearing.  Observation.  Call clinic for new or changing lesions.  Recommend daily use of broad spectrum spf 30+ sunscreen to sun-exposed areas.    Recheck on f/up, Consider bx if changes noted  Return in about 2 months (around 12/11/2020) for AK f/u and recheck L nasal tip.  I, [name], RMA, am acting as scribe for [name], MD .  Documentation: I have reviewed the above documentation for accuracy and completeness, and I agree with the above.  [name] MD

## 2020-12-13 ENCOUNTER — Encounter: Payer: Self-pay | Admitting: Dermatology

## 2020-12-13 ENCOUNTER — Other Ambulatory Visit: Payer: Self-pay

## 2020-12-13 ENCOUNTER — Ambulatory Visit (INDEPENDENT_AMBULATORY_CARE_PROVIDER_SITE_OTHER): Payer: BC Managed Care – PPO | Admitting: Dermatology

## 2020-12-13 DIAGNOSIS — Z85828 Personal history of other malignant neoplasm of skin: Secondary | ICD-10-CM

## 2020-12-13 DIAGNOSIS — L738 Other specified follicular disorders: Secondary | ICD-10-CM | POA: Diagnosis not present

## 2020-12-13 DIAGNOSIS — L570 Actinic keratosis: Secondary | ICD-10-CM

## 2020-12-13 DIAGNOSIS — L814 Other melanin hyperpigmentation: Secondary | ICD-10-CM

## 2020-12-13 DIAGNOSIS — L578 Other skin changes due to chronic exposure to nonionizing radiation: Secondary | ICD-10-CM

## 2020-12-13 DIAGNOSIS — Z872 Personal history of diseases of the skin and subcutaneous tissue: Secondary | ICD-10-CM

## 2020-12-13 NOTE — Progress Notes (Signed)
Follow-Up Visit   Subjective  Sylvia Daugherty is a 55 y.o. female who presents for the following: Actinic Keratosis (L lower nasal dorsum, L central upper forehead, L antihelix, chest, R upper knee, R ant shoulder, LN2, pt did not do field txt to chest) and recheck Sebaceous hyperplasia (L nasal tip, 44m f/u).   The following portions of the chart were reviewed this encounter and updated as appropriate:       Review of Systems:  No other skin or systemic complaints except as noted in HPI or Assessment and Plan.  Objective  Well appearing patient in no apparent distress; mood and affect are within normal limits.  A focused examination was performed including face, chest, R knee, L antihelix, L ant shoulder. Relevant physical exam findings are noted in the Assessment and Plan.  chest Pink scaly macules   L nasal tip 5.43mm flesh firm pap with central pore  upper sternum 7.0 x 6.63mm speckled brown macule darker edge      Assessment & Plan   Actinic Damage - Severe, confluent actinic changes with pre-cancerous actinic keratoses  - Severe, chronic, not at goal, secondary to cumulative UV radiation exposure over time - diffuse scaly erythematous macules and papules with underlying dyspigmentation - Discussed Prescription "Field Treatment" for Severe, Chronic Confluent Actinic Changes with Pre-Cancerous Actinic Keratoses Field treatment involves treatment of an entire area of skin that has confluent Actinic Changes (Sun/ Ultraviolet light damage) and PreCancerous Actinic Keratoses by method of PhotoDynamic Therapy (PDT) and/or prescription Topical Chemotherapy agents such as 5-fluorouracil, 5-fluorouracil/calcipotriene, and/or imiquimod.  The purpose is to decrease the number of clinically evident and subclinical PreCancerous lesions to prevent progression to development of skin cancer by chemically destroying early precancer changes that may or may not be visible.  It has been shown to  reduce the risk of developing skin cancer in the treated area. As a result of treatment, redness, scaling, crusting, and open sores may occur during treatment course. One or more than one of these methods may be used and may have to be used several times to control, suppress and eliminate the PreCancerous changes. Discussed treatment course, expected reaction, and possible side effects. - Recommend daily broad spectrum sunscreen SPF 30+ to sun-exposed areas, reapply every 2 hours as needed.  - Staying in the shade or wearing long sleeves, sun glasses (UVA+UVB protection) and wide brim hats (4-inch brim around the entire circumference of the hat) are also recommended. - Call for new or changing lesions.  Start 5-fluorouracil/calcipotriene cream twice a day for 7 days to affected areas including chest. Prescription sent to [facility] Patient provided with contact information for pharmacy and advised the pharmacy will mail the prescription to their home. Patient provided with handout reviewing treatment course and side effects and advised to call or message [place] on MyChart with any concerns.   AK (actinic keratosis) chest  Plan 5FU/Calcipotriene bid for 7-10 days.  5-fluorouracil/calcipotriene cream is is a type of field treatment used to treat precancers, thin skin cancers, and areas of sun damage. Reviewed expected reaction including irritation and mild inflammation potentially progressing to more severe inflammation including redness, scaling, crusting and open sores/erosions.  Reviewed if too much irritation occurs, ensure application of only a thin layer and decrease frequency of use to achieve a tolerable level of inflammation. Recommend applying Vaseline ointment to open sores as needed.  Minimize sun exposure while under treatment. Recommend daily broad spectrum sunscreen SPF 30+ to sun-exposed areas, reapply  every 2 hours as needed.       Related Medications fluorouracil (EFUDEX) 5 %  cream Apply topically 2 (two) times daily. Bid for 7-10 days to chest  Sebaceous hyperplasia L nasal tip  Stable as compared to photo  Benign-appearing.  Observation.  Call clinic for new or changing lesions.  Recommend daily use of broad spectrum spf 30+ sunscreen to sun-exposed areas.    Lentigines upper sternum  Vrs SK  Benign-appearing.  Observation.  Call clinic for new or changing lesions.  Recommend daily use of broad spectrum spf 30+ sunscreen to sun-exposed areas.    History of PreCancerous Actinic Keratosis  - site(s) of PreCancerous Actinic Keratosis clear today. - these may recur and new lesions may form requiring treatment to prevent transformation into skin cancer - observe for new or changing spots and contact [place] for appointment if occur - photoprotection with sun protective clothing; sunglasses and broad spectrum sunscreen with SPF of at least 30 + and frequent self skin exams recommended - yearly exams by a dermatologist recommended for persons with history of PreCancerous Actinic Keratoses    History of Basal Cell Carcinoma of the Skin - No evidence of recurrence today L nasal root - Recommend regular full body skin exams - Recommend daily broad spectrum sunscreen SPF 30+ to sun-exposed areas, reapply every 2 hours as needed.  - Call if any new or changing lesions are noted between office visits   Return in about 10 months (around 10/13/2021) for TBSE, Hx of BCC, Hx of AKs.  I, Sylvia Daugherty, RMA, am acting as scribe for Sylvia Patty, MD .  Documentation: I have reviewed the above documentation for accuracy and completeness, and I agree with the above.  Sylvia Patty MD

## 2020-12-13 NOTE — Patient Instructions (Signed)

## 2021-03-14 ENCOUNTER — Encounter: Payer: BC Managed Care – PPO | Admitting: Certified Nurse Midwife

## 2021-04-19 ENCOUNTER — Encounter: Payer: Self-pay | Admitting: Certified Nurse Midwife

## 2021-04-19 ENCOUNTER — Ambulatory Visit (INDEPENDENT_AMBULATORY_CARE_PROVIDER_SITE_OTHER): Payer: BC Managed Care – PPO | Admitting: Certified Nurse Midwife

## 2021-04-19 ENCOUNTER — Other Ambulatory Visit (HOSPITAL_COMMUNITY)
Admission: RE | Admit: 2021-04-19 | Discharge: 2021-04-19 | Disposition: A | Discharge status: Home / Self Care | Payer: BC Managed Care – PPO | Source: Ambulatory Visit | Attending: Certified Nurse Midwife | Admitting: Certified Nurse Midwife

## 2021-04-19 VITALS — BP 122/74 | HR 54 | Ht 64.0 in | Wt 135.7 lb

## 2021-04-19 DIAGNOSIS — Z124 Encounter for screening for malignant neoplasm of cervix: Secondary | ICD-10-CM | POA: Diagnosis present

## 2021-04-19 DIAGNOSIS — Z1231 Encounter for screening mammogram for malignant neoplasm of breast: Secondary | ICD-10-CM | POA: Diagnosis not present

## 2021-04-19 DIAGNOSIS — Z01419 Encounter for gynecological examination (general) (routine) without abnormal findings: Secondary | ICD-10-CM

## 2021-04-19 DIAGNOSIS — Z1329 Encounter for screening for other suspected endocrine disorder: Secondary | ICD-10-CM

## 2021-04-19 MED ORDER — CYANOCOBALAMIN 1000 MCG/ML IJ SOLN: 1000.0000 ug | INTRAMUSCULAR | 11 refills | Status: DC

## 2021-04-19 MED ORDER — VALACYCLOVIR HCL 1 G PO TABS
1000.0000 mg | ORAL_TABLET | Freq: Two times a day (BID) | ORAL | 3 refills | Status: DC
Start: 2021-04-19 — End: 2022-05-10

## 2021-04-19 NOTE — Progress Notes (Signed)
? ? ?GYNECOLOGY ANNUAL PREVENTATIVE CARE ENCOUNTER NOTE ? ?History:    ? Sylvia Daugherty is a 56 y.o. [id number] female here for a routine annual gynecologic exam.  Current complaints: none.   Denies abnormal vaginal bleeding, discharge, pelvic pain, problems with intercourse or other gynecologic concerns.  ?  ? ?Social ?Relationship: married ?Living: spouse  ?Work: Cabin crew at [employer]  ?Exercise: few x wk  ?Smoke/Alcohol/drug use: occasional alcohol , no drugs or smoking ? ?Gynecologic History ?No LMP recorded. Patient is postmenopausal. ?Contraception: post menopausal status and tubal ligation ?Last Pap: 02/17/2016. Results were: normal with negative HPV ?Last mammogram: 05/12/2020. Results were: normal ? ?Obstetric History ?OB History  ?Gravida Para Term Preterm AB Living  ?3 3 0 0 0    ?SAB IAB Ectopic Multiple Live Births  ?0 0 0      ?  ?# Outcome Date GA Lbr Len/2nd Weight Sex Delivery Anes PTL Lv  ?3 Para           ?2 Para           ?1 Para           ?  ?Obstetric Comments  ?FIRST PREGNANCY 16  ?FIRST MENSTRUAL 10  ? ? ?Past Medical History:  ?Diagnosis Date  ? Actinic keratosis   ? Basal cell carcinoma 02/13/2017  ? L medial canthus  ? Diffuse cystic mastopathy 2011  ? RIGHT   ? History of basal cell carcinoma (BCC) 02/25/2019  ?  left nasal root, nodular pattern   ? 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 ? ?Past Surgical History:  ?Procedure Laterality Date  ? BREAST BIOPSY Right 03-26-14  ? FIBROCYSTIC CHANGES WITH CALC. and [place]  ? BREAST BIOPSY Right 2012  ? core with clip-neg  ? BREAST BIOPSY Right 2014  ? core with clip-neg  ? COLONOSCOPY  2013  ? DR.ELLIOTT  ? COLONOSCOPY WITH PROPOFOL N/A 03/10/2016  ? Procedure: COLONOSCOPY WITH PROPOFOL;  Surgeon: Lucilla Lame, MD;  Location: [place] [facility];  Service: Endoscopy;  Laterality: N/A;  ? POLYPECTOMY  03/10/2016  ? Procedure: POLYPECTOMY;  Surgeon: Lucilla Lame, MD;  Location: [place];  Service: Endoscopy;;  ? [place]   ? ? ?Current Outpatient Medications on File Prior to Visit  ?Medication Sig Dispense Refill  ? cyanocobalamin (,VITAMIN B-12,) 1000 MCG/ML injection Inject 1 mL (1,000 mcg total) into the muscle every 30 (thirty) days. 10 mL 11  ? Melatonin 5 MG TABS Take 2 tablets (10 mg total) by mouth at bedtime. 60 tablet 3  ? valACYclovir (VALTREX) 1000 MG tablet Take 1 tablet (1,000 mg total) by mouth 2 (two) times daily. 60 tablet 3  ? Estradiol-Norethindrone Acet 0.5-0.1 MG tablet TAKE 1 TABLET BY MOUTH EVERY DAY (Patient not taking: Reported on 04/19/2021) 84 tablet 0  ? fluorouracil (EFUDEX) 5 % cream Apply topically 2 (two) times daily. Bid for 7-10 days to chest (Patient not taking: Reported on 04/19/2021) 15 g 1  ? metroNIDAZOLE (FLAGYL) 500 MG tablet Take 1 tablet (500 mg total) by mouth 2 (two) times daily. (Patient not taking: Reported on 10/11/2020) 14 tablet 0  ? MIMVEY LO 0.5-0.1 MG tablet Take 1 tablet by mouth daily. (Patient not taking: Reported on 10/11/2020) 28 tablet 11  ? PICATO 0.05 % GEL APPLY ON THE SKIN AT BEDTIME APPLY TO CHEST. (Patient not taking: Reported on 10/11/2020) 2 each 4  ? ?No current facility-administered medications on file prior to  visit.  ? ? ?No Known Allergies ? ?Social History:  reports that she has never smoked. She has never used smokeless tobacco. She reports current alcohol use. She reports that she does not use drugs. ? ?Family History  ?Problem Relation Age of Onset  ? Diabetes Father   ? Breast cancer Neg Hx   ? ? ?The following portions of the patient's history were reviewed and updated as appropriate: allergies, current medications, past family history, past medical history, past social history, past surgical history and problem list. ? ?Review of Systems ?Pertinent items noted in HPI and remainder of comprehensive ROS otherwise negative. ? ?Physical Exam:  ?BP 122/74   Pulse (!) 54   Ht '5\' 4"'$  (1.626 m)   Wt 135 lb 11.2 oz (61.6 kg)   BMI 23.29 kg/m?  ?CONSTITUTIONAL:  Well-developed, well-nourished female in no acute distress.  ?HENT:  Normocephalic, atraumatic, External right and left ear normal. Oropharynx is clear and moist ?EYES: Conjunctivae and EOM are normal. Pupils are equal, round, and reactive to light. No scleral icterus.  ?NECK: Normal range of motion, supple, no masses.  Normal thyroid.  ?SKIN: Skin is warm and dry. No rash noted. Not diaphoretic. No erythema. No pallor. ?MUSCULOSKELETAL: Normal range of motion. No tenderness.  No cyanosis, clubbing, or edema.  2+ distal pulses. ?NEUROLOGIC: Alert and oriented to person, place, and time. Normal reflexes, muscle tone coordination.  ?PSYCHIATRIC: Normal mood and affect. Normal behavior. Normal judgment and thought content. ?CARDIOVASCULAR: Normal heart rate noted, regular rhythm ?RESPIRATORY: Clear to auscultation bilaterally. Effort and breath sounds normal, no problems with respiration noted. ?BREASTS: Symmetric in size. No masses, tenderness, skin changes, nipple drainage, or lymphadenopathy bilaterally.  ?ABDOMEN: Soft, no distention noted.  No tenderness, rebound or guarding.  ?PELVIC: Normal appearing external genitalia and urethral meatus; normal appearing vaginal mucosa and cervix.  No abnormal discharge noted.  Pap smear obtained.  Normal uterine size, no other palpable masses, no uterine or adnexal tenderness.  . ?  ?Assessment and Plan:  ?  1. Well woman exam with routine gynecological exam ? ?Pap: Will follow up results of pap smear and manage accordingly ?Mammogram : ordered ?Labs: TSH panel ?Refills: Valtrex, Vitamin B 12 ?Referral: none  ?Colonoscopy done 2018 ?Routine preventative health maintenance measures emphasized. ?Please refer to After Visit Summary for other counseling recommendations.  ?   ? ?Philip Aspen, CNM ?[facility] ?[facility]   ?

## 2021-04-20 LAB — THYROID PANEL WITH TSH
Free Thyroxine Index: 2.2 (ref 1.2–4.9)
T3 Uptake Ratio: 31 % (ref 24–39)
T4, Total: 7 ug/dL (ref 4.5–12.0)
TSH: 1.26 u[IU]/mL (ref 0.450–4.500)

## 2021-04-26 LAB — CYTOLOGY - PAP
Comment: NEGATIVE
Diagnosis: NEGATIVE
Diagnosis: REACTIVE
High risk HPV: NEGATIVE

## 2021-06-02 ENCOUNTER — Ambulatory Visit
Admission: RE | Admit: 2021-06-02 | Discharge: 2021-06-02 | Disposition: A | Discharge status: Home / Self Care | Payer: BC Managed Care – PPO | Source: Ambulatory Visit | Attending: Certified Nurse Midwife | Admitting: Certified Nurse Midwife

## 2021-06-02 DIAGNOSIS — Z01419 Encounter for gynecological examination (general) (routine) without abnormal findings: Secondary | ICD-10-CM | POA: Diagnosis not present

## 2021-06-02 DIAGNOSIS — Z1231 Encounter for screening mammogram for malignant neoplasm of breast: Secondary | ICD-10-CM | POA: Diagnosis present

## 2021-06-21 ENCOUNTER — Encounter: Payer: Self-pay | Admitting: Certified Nurse Midwife

## 2021-06-22 ENCOUNTER — Telehealth: Payer: Self-pay | Admitting: Certified Nurse Midwife

## 2021-06-22 ENCOUNTER — Other Ambulatory Visit: Payer: Self-pay | Admitting: Obstetrics

## 2021-06-22 DIAGNOSIS — K449 Diaphragmatic hernia without obstruction or gangrene: Secondary | ICD-10-CM

## 2021-06-22 NOTE — Telephone Encounter (Signed)
PT states she is having a full blown UTI infection. Offered Nurse schedule for a Urine Drop-Off for Friday 06/22/2021. Pt states "she can not wait that long". Advised per CMA if in  discomfort to seek evaluation at Urgent Care as there are no current opening's available. Pt is requesting an RX be sent in as soon as possible . I reiterated that it could possibly take 24 hours or more to hear back from a provider/CMA and again seek treatment at Urgent Care if pain became worse. Pt verbalized understanding yet was upset with response. Pt would like a return call

## 2021-06-24 ENCOUNTER — Ambulatory Visit (INDEPENDENT_AMBULATORY_CARE_PROVIDER_SITE_OTHER): Payer: BC Managed Care – PPO | Admitting: Certified Nurse Midwife

## 2021-06-24 VITALS — Temp 98.0°F

## 2021-06-24 DIAGNOSIS — R350 Frequency of micturition: Secondary | ICD-10-CM

## 2021-06-24 LAB — POCT URINALYSIS DIPSTICK
Bilirubin, UA: NEGATIVE
Glucose, UA: NEGATIVE
Ketones, UA: NEGATIVE
Nitrite, UA: POSITIVE
Protein, UA: NEGATIVE
Spec Grav, UA: 1.025 (ref 1.010–1.025)
Urobilinogen, UA: NEGATIVE E.U./dL — AB
pH, UA: 7.5 (ref 5.0–8.0)

## 2021-06-24 MED ORDER — NITROFURANTOIN MONOHYD MACRO 100 MG PO CAPS: 100.0000 mg | ORAL_CAPSULE | Freq: Two times a day (BID) | ORAL | 0 refills | Status: DC

## 2021-06-24 NOTE — Progress Notes (Unsigned)
Patient presents for urinary frequency pain while urinating and pressure when voiding, symptoms consistent with UTI per Protocol sent medication in to pharmacy and urine for culture.

## 2021-06-30 LAB — URINE CULTURE

## 2021-07-04 ENCOUNTER — Other Ambulatory Visit: Payer: Self-pay | Admitting: Certified Nurse Midwife

## 2021-07-04 ENCOUNTER — Encounter: Payer: Self-pay | Admitting: Certified Nurse Midwife

## 2021-07-04 MED ORDER — CIPROFLOXACIN HCL 250 MG PO TABS
250.0000 mg | ORAL_TABLET | Freq: Two times a day (BID) | ORAL | 0 refills | Status: AC
Start: 2021-07-04 — End: 2021-07-07

## 2021-10-17 ENCOUNTER — Ambulatory Visit (INDEPENDENT_AMBULATORY_CARE_PROVIDER_SITE_OTHER): Payer: BC Managed Care – PPO | Admitting: Dermatology

## 2021-10-17 ENCOUNTER — Encounter: Payer: Self-pay | Admitting: Dermatology

## 2021-10-17 DIAGNOSIS — L578 Other skin changes due to chronic exposure to nonionizing radiation: Secondary | ICD-10-CM

## 2021-10-17 DIAGNOSIS — Z85828 Personal history of other malignant neoplasm of skin: Secondary | ICD-10-CM

## 2021-10-17 DIAGNOSIS — Z1283 Encounter for screening for malignant neoplasm of skin: Secondary | ICD-10-CM

## 2021-10-17 DIAGNOSIS — L570 Actinic keratosis: Secondary | ICD-10-CM | POA: Diagnosis not present

## 2021-10-17 DIAGNOSIS — D229 Melanocytic nevi, unspecified: Secondary | ICD-10-CM

## 2021-10-17 DIAGNOSIS — D225 Melanocytic nevi of trunk: Secondary | ICD-10-CM

## 2021-10-17 DIAGNOSIS — L814 Other melanin hyperpigmentation: Secondary | ICD-10-CM

## 2021-10-17 DIAGNOSIS — D2262 Melanocytic nevi of left upper limb, including shoulder: Secondary | ICD-10-CM

## 2021-10-17 DIAGNOSIS — L738 Other specified follicular disorders: Secondary | ICD-10-CM

## 2021-10-17 DIAGNOSIS — L821 Other seborrheic keratosis: Secondary | ICD-10-CM

## 2021-10-17 DIAGNOSIS — D2272 Melanocytic nevi of left lower limb, including hip: Secondary | ICD-10-CM

## 2021-10-17 MED ORDER — FLUOROURACIL 5 % EX CREA: TOPICAL_CREAM | Freq: Two times a day (BID) | CUTANEOUS | 1 refills | Status: DC

## 2021-10-17 NOTE — Patient Instructions (Addendum)
Start 5-fluorouracil/calcipotriene cream twice a day for 7 days to affected areas including chest. Prescription sent to [facility].  5-Fluorouracil/Calcipotriene Patient Education   Actinic keratoses are the dry, red scaly spots on the skin caused by sun damage. A portion of these spots can turn into skin cancer with time, and treating them can help prevent development of skin cancer.   Treatment of these spots requires removal of the defective skin cells. There are various ways to remove actinic keratoses, including freezing with liquid nitrogen, treatment with creams, or treatment with a blue light procedure in the office.   5-fluorouracil cream is a topical cream used to treat actinic keratoses. It works by interfering with the growth of abnormal fast-growing skin cells, such as actinic keratoses. These cells peel off and are replaced by healthy ones.   5-fluorouracil/calcipotriene is a combination of the 5-fluorouracil cream with a vitamin D analog cream called calcipotriene. The calcipotriene alone does not treat actinic keratoses. However, when it is combined with 5-fluorouracil, it helps the 5-fluorouracil treat the actinic keratoses much faster so that the same results can be achieved with a much shorter treatment time.  INSTRUCTIONS FOR 5-FLUOROURACIL/CALCIPOTRIENE CREAM:   5-fluorouracil/calcipotriene cream typically only needs to be used for 4-7 days. A thin layer should be applied twice a day to the treatment areas recommended by your physician.   If your physician prescribed you separate tubes of 5-fluourouracil and calcipotriene, apply a thin layer of 5-fluorouracil followed by a thin layer of calcipotriene.   Avoid contact with your eyes, nostrils, and mouth. Do not use 5-fluorouracil/calcipotriene cream on infected or open wounds.   You will develop redness, irritation and some crusting at areas where you have pre-cancer damage/actinic keratoses. IF YOU  DEVELOP PAIN, BLEEDING, OR SIGNIFICANT CRUSTING, STOP THE TREATMENT EARLY - you have already gotten a good response and the actinic keratoses should clear up well.  Wash your hands after applying 5-fluorouracil 5% cream on your skin.   A moisturizer or sunscreen with a minimum SPF 30 should be applied each morning.   Once you have finished the treatment, you can apply a thin layer of Vaseline twice a day to irritated areas to soothe and calm the areas more quickly. If you experience significant discomfort, contact your physician.  For some patients it is necessary to repeat the treatment for best results.  SIDE EFFECTS: When using 5-fluorouracil/calcipotriene cream, you may have mild irritation, such as redness, dryness, swelling, or a mild burning sensation. This usually resolves within 2 weeks. The more actinic keratoses you have, the more redness and inflammation you can expect during treatment. Eye irritation has been reported rarely. If this occurs, please let us know.  If you have any trouble using this cream, please call the office. If you have any other questions about this information, please do not hesitate to ask me before you leave the office.     Instructions for [facility] Medications  One or more of your medications was sent to the [facility]. You will receive an email from them and can purchase the medicine through that link. It will then be mailed to your home at the address you confirmed. If for any reason you do not receive an email from them, please check your spam folder. If you still do not find the email, please let us know. [facility] phone number is [phone].     Cryotherapy Aftercare  Wash gently with soap and water  everyday.   Apply Vaseline and Band-Aid daily until healed.   Due to recent changes in healthcare laws, you may see results of your pathology and/or laboratory studies on MyChart before the doctors  have had a chance to review them. We understand that in some cases there may be results that are confusing or concerning to you. Please understand that not all results are received at the same time and often the doctors may need to interpret multiple results in order to provide you with the best plan of care or course of treatment. Therefore, we ask that you please give [place] 2 business days to thoroughly review all your results before contacting the office for clarification. Should we see a critical lab result, you will be contacted sooner.   If You Need Anything After Your Visit  If you have any questions or concerns for your doctor, please call our main line at [phone] and press option 4 to reach your doctor's medical assistant. If no one answers, please leave a voicemail as directed and we will return your call as soon as possible. Messages left after 4 pm will be answered the following business day.   You may also send [place] a message via [place]. We typically respond to MyChart messages within 1-2 business days.  For prescription refills, please ask your pharmacy to contact our office. Our fax number is [phone].  If you have an urgent issue when the clinic is closed that cannot wait until the next business day, you can page your doctor at the number below.    Please note that while we do our best to be available for urgent issues outside of office hours, we are not available 24/7.   If you have an urgent issue and are unable to reach [place], you may choose to seek medical care at your doctor's office, retail clinic, urgent care center, or emergency room.  If you have a medical emergency, please immediately call 911 or go to the emergency department.  Pager Numbers  - Dr. Nehemiah Massed: [phone]  - Dr. Laurence Ferrari: [phone]  - Dr. Nicole Kindred: [phone]  In the event of inclement weather, please call our main line at [phone] for an update on the status of any delays or  closures.  Dermatology Medication Tips: Please keep the boxes that topical medications come in in order to help keep track of the instructions about where and how to use these. Pharmacies typically print the medication instructions only on the boxes and not directly on the medication tubes.   If your medication is too expensive, please contact our office at [phone] option 4 or send [place] a message through [place].   We are unable to tell what your co-pay for medications will be in advance as this is different depending on your insurance coverage. However, we may be able to find a substitute medication at lower cost or fill out paperwork to get insurance to cover a needed medication.   If a prior authorization is required to get your medication covered by your insurance company, please allow [place] 1-2 business days to complete this process.  Drug prices often vary depending on where the prescription is filled and some pharmacies may offer cheaper prices.  The website www.goodrx.com contains coupons for medications through different pharmacies. The prices here do not account for what the cost may be with help from insurance (it may be cheaper with your insurance), but the website can give you the price if you did not use any  insurance.  - You can print the associated coupon and take it with your prescription to the pharmacy.  - You may also stop by our office during regular business hours and pick up a GoodRx coupon card.  - If you need your prescription sent electronically to a different pharmacy, notify our office through [facility] or by phone at [phone] option 4.     Si Usted Necesita Algo Despus de Su Visita  Tambin puede enviarnos un mensaje a travs de Pharmacist, community. Por lo general respondemos a los mensajes de MyChart en el transcurso de 1 a 2 das hbiles.  Para renovar recetas, por favor pida a su farmacia que se ponga en contacto con nuestra oficina. Harland Dingwall de fax  es [place] [phone].  Si tiene un asunto urgente cuando la clnica est cerrada y que no puede esperar hasta el siguiente da hbil, puede llamar/localizar a su doctor(a) al nmero que aparece a continuacin.   Por favor, tenga en cuenta que aunque hacemos todo lo posible para estar disponibles para asuntos urgentes fuera del horario de [place], no estamos disponibles las 24 horas del da, los 7 das de la [place].   Si tiene un problema urgente y no puede comunicarse con nosotros, puede optar por buscar atencin mdica  en el consultorio de su doctor(a), en una clnica privada, en un centro de atencin urgente o en una sala de emergencias.  Si tiene Engineering geologist, por favor llame inmediatamente al 911 o vaya a la sala de emergencias.  Nmeros de bper  - Dr. Nehemiah Massed: [phone]  - Dra. Moye: [phone]  - Dra. Nicole Kindred: [phone]  En caso de inclemencias del [place], por favor llame a Johnsie Kindred principal al [phone] para una actualizacin sobre el [place] de cualquier retraso o cierre.  Consejos para la medicacin en dermatologa: Por favor, guarde las cajas en las que vienen los medicamentos de uso tpico para ayudarle a seguir las instrucciones sobre dnde y cmo usarlos. Las farmacias generalmente imprimen las instrucciones del medicamento slo en las cajas y no directamente en los tubos del [place].   Si su medicamento es muy caro, por favor, pngase en contacto con Zigmund Daniel llamando al [phone] y presione la opcin 4 o envenos un mensaje a travs de Pharmacist, community.   No podemos decirle cul ser su copago por los medicamentos por adelantado ya que esto es diferente dependiendo de la cobertura de su seguro. Sin embargo, es posible que podamos encontrar un medicamento sustituto a Electrical engineer un formulario para que el seguro cubra el medicamento que se considera necesario.   Si se requiere una autorizacin previa para que su compaa de seguros [place]  su medicamento, por favor permtanos de 1 a 2 das hbiles para completar este proceso.  Los precios de los medicamentos varan con frecuencia dependiendo del Environmental consultant de dnde se surte la receta y alguna farmacias pueden ofrecer precios ms baratos.  El sitio web www.goodrx.com tiene cupones para medicamentos de Airline pilot. Los precios aqu no tienen en cuenta lo que podra costar con la ayuda del seguro (puede ser ms barato con su seguro), pero el sitio web puede darle el precio si no utiliz Research scientist (physical sciences).  - Puede imprimir el cupn correspondiente y llevarlo con su receta a la farmacia.  - Tambin puede pasar por nuestra oficina durante el horario de atencin regular y Charity fundraiser una tarjeta de cupones de GoodRx.  - Si necesita que su receta se enve electrnicamente a Ardelia Mems  farmacia diferente, informe a nuestra oficina a travs de MyChart de [place] o por telfono llamando al [phone] y presione la opcin 4.

## 2021-10-17 NOTE — Progress Notes (Signed)
Follow-Up Visit   Subjective  Sylvia Daugherty is a 56 y.o. female who presents for the following: Total body skin exam (Hx of BCCs L medial canthus, L nasal root), Actinic Keratosis (Chest, pt did not use 5FU/Calcipotriene), and check spots (Scabs, R arm, R shoulder/Check spot top of scalp).  The patient presents for Total-Body Skin Exam (TBSE) for skin cancer screening and mole check.  The patient has spots, moles and lesions to be evaluated, some may be new or changing and the patient has concerns that these could be cancer.   The following portions of the chart were reviewed this encounter and updated as appropriate:       Review of Systems:  No other skin or systemic complaints except as noted in HPI or Assessment and Plan.  Objective  Well appearing patient in no apparent distress; mood and affect are within normal limits.  A full examination was performed including scalp, head, eyes, ears, nose, lips, neck, chest, axillae, abdomen, back, buttocks, bilateral upper extremities, bilateral lower extremities, hands, feet, fingers, toes, fingernails, and toenails. All findings within normal limits unless otherwise noted below.  L antehelix x 1, R ant shoulder x 1, R upper arm x 1, R forearm x 1, L nasal tip x 1 (5) Pink scaly macules  Mid sternum 0.6cm speckled brown macule darker edge, stable from previous visit, photo compared  L nasal tip 4.66m indistinct flesh yellow lobulated pap with central dell  L lat foot; L post shoulder; L mid back at braline  L lat foot 3.076mbrown macule  L mid back at braline 3.0 x 2.44m28med dark brown macule  L post shoulder 2.44mm4mown macule  R parietal scalp Slightly firm flat flesh pap    Assessment & Plan   Lentigines - Scattered tan macules - Due to sun exposure - Benign-appearing, observe - Recommend daily broad spectrum sunscreen SPF 30+ to sun-exposed areas, reapply every 2 hours as needed. - Call for any changes - back,  chest  Seborrheic Keratoses - Stuck-on, waxy, tan-brown papules and/or plaques  - Benign-appearing - Discussed benign etiology and prognosis. - Observe - Call for any changes - legs  Melanocytic Nevi - Tan-brown and/or pink-flesh-colored symmetric macules and papules - Benign appearing on exam today - Observation - Call clinic for new or changing moles - Recommend daily use of broad spectrum spf 30+ sunscreen to sun-exposed areas.   Hemangiomas - Red papules - Discussed benign nature - Observe - Call for any changes  Actinic Damage - Severe, confluent actinic changes with pre-cancerous actinic keratoses  - Severe, chronic, not at goal, secondary to cumulative UV radiation exposure over time - diffuse scaly erythematous macules and papules with underlying dyspigmentation - Discussed Prescription "Field Treatment" for Severe, Chronic Confluent Actinic Changes with Pre-Cancerous Actinic Keratoses Field treatment involves treatment of an entire area of skin that has confluent Actinic Changes (Sun/ Ultraviolet light damage) and PreCancerous Actinic Keratoses by method of PhotoDynamic Therapy (PDT) and/or prescription Topical Chemotherapy agents such as 5-fluorouracil, 5-fluorouracil/calcipotriene, and/or imiquimod.  The purpose is to decrease the number of clinically evident and subclinical PreCancerous lesions to prevent progression to development of skin cancer by chemically destroying early precancer changes that may or may not be visible.  It has been shown to reduce the risk of developing skin cancer in the treated area. As a result of treatment, redness, scaling, crusting, and open sores may occur during treatment course. One or more than one of these methods  may be used and may have to be used several times to control, suppress and eliminate the PreCancerous changes. Discussed treatment course, expected reaction, and possible side effects. - Recommend daily broad spectrum sunscreen SPF  30+ to sun-exposed areas, reapply every 2 hours as needed.  - Staying in the shade or wearing long sleeves, sun glasses (UVA+UVB protection) and wide brim hats (4-inch brim around the entire circumference of the hat) are also recommended. - Call for new or changing lesions.  - - Start 5-fluorouracil/calcipotriene cream twice a day for 7 days to affected areas including chest. Prescription sent to [facility]. Patient advised they will receive an email to purchase the medication online and have it sent to their home. Patient provided with handout reviewing treatment course and side effects and advised to call or message [place] on MyChart with any concerns.  Reviewed course of treatment and expected reaction.  Patient advised to expect inflammation and crusting and advised that erosions are possible.  Patient advised to be diligent with sun protection during and after treatment. Counseled to keep medication out of reach of children and pets.   Skin cancer screening performed today.   History of Basal Cell Carcinoma of the Skin - No evidence of recurrence today - Recommend regular full body skin exams - Recommend daily broad spectrum sunscreen SPF 30+ to sun-exposed areas, reapply every 2 hours as needed.  - Call if any new or changing lesions are noted between office visits  - L medial canthus, L nasal root, R ear concha, L lower nasal dorsum  AK (actinic keratosis) (5) L antehelix x 1, R ant shoulder x 1, R upper arm x 1, R forearm x 1, L nasal tip x 1  (R upper arm and R forearm, AK vs ISK)  fluorouracil (EFUDEX) 5 % cream - L antehelix x 1, R ant shoulder x 1, R upper arm x 1, R forearm x 1, L nasal tip x 1 Apply topically 2 (two) times daily. Bid to chest for 7 days  Destruction of lesion - L antehelix x 1, R ant shoulder x 1, R upper arm x 1, R forearm x 1, L nasal tip x 1  Destruction method: cryotherapy   Informed consent: discussed and consent obtained   Lesion  destroyed using liquid nitrogen: Yes   Region frozen until ice ball extended beyond lesion: Yes   Outcome: patient tolerated procedure well with no complications   Post-procedure details: wound care instructions given   Additional details:  Prior to procedure, discussed risks of blister formation, small wound, skin dyspigmentation, or rare scar following cryotherapy. Recommend Vaseline ointment to treated areas while healing.   Lentigines Mid sternum  Benign-appearing. Stable compared to previous visit. Observation.  Call clinic for new or changing moles.  Recommend daily use of broad spectrum spf 30+ sunscreen to sun-exposed areas.    Sebaceous hyperplasia L nasal tip  Benign-appearing. Stable compared to previous visit. Observation.  Recommend daily use of broad spectrum spf 30+ sunscreen to sun-exposed areas.    Nevus (3) L lat foot; L post shoulder; L mid back at braline  Benign-appearing.  Observation.  Call clinic for new or changing moles.  Recommend daily use of broad spectrum spf 30+ sunscreen to sun-exposed areas.    L lat foot stable  Seborrheic keratosis R parietal scalp  Reassured benign age-related growth.  Recommend observation.  Discussed cryotherapy if spot(s) become irritated or inflamed.    Return in about 1 year (  around 10/18/2022) for TBSE, Hx of BCC, Hx of AKs.  I, [name], RMA, am acting as scribe for [name], MD .  Documentation: I have reviewed the above documentation for accuracy and completeness, and I agree with the above.  [name] MD

## 2022-03-13 ENCOUNTER — Telehealth: Payer: Self-pay | Admitting: Certified Nurse Midwife

## 2022-03-13 NOTE — Telephone Encounter (Signed)
Left message for patient to call office back to r/s appt on 04/25/22

## 2022-03-22 NOTE — Telephone Encounter (Signed)
(  out of office, offered two dates for scheduling patient was unable, I put in for recall to notifiy patient to call back to scheduled)

## 2022-04-25 ENCOUNTER — Encounter: Payer: Self-pay | Admitting: Certified Nurse Midwife

## 2022-05-10 ENCOUNTER — Encounter: Payer: Self-pay | Admitting: Certified Nurse Midwife

## 2022-05-10 ENCOUNTER — Other Ambulatory Visit (HOSPITAL_COMMUNITY)
Admission: RE | Admit: 2022-05-10 | Discharge: 2022-05-10 | Disposition: A | Discharge status: Home / Self Care | Payer: BC Managed Care – PPO | Source: Ambulatory Visit | Attending: Certified Nurse Midwife | Admitting: Certified Nurse Midwife

## 2022-05-10 ENCOUNTER — Ambulatory Visit (INDEPENDENT_AMBULATORY_CARE_PROVIDER_SITE_OTHER): Payer: BC Managed Care – PPO | Admitting: Certified Nurse Midwife

## 2022-05-10 ENCOUNTER — Other Ambulatory Visit: Payer: Self-pay | Admitting: Certified Nurse Midwife

## 2022-05-10 VITALS — BP 122/74 | HR 66 | Ht 63.0 in | Wt 137.2 lb

## 2022-05-10 DIAGNOSIS — Z862 Personal history of diseases of the blood and blood-forming organs and certain disorders involving the immune mechanism: Secondary | ICD-10-CM

## 2022-05-10 DIAGNOSIS — Z01419 Encounter for gynecological examination (general) (routine) without abnormal findings: Secondary | ICD-10-CM | POA: Insufficient documentation

## 2022-05-10 DIAGNOSIS — N941 Unspecified dyspareunia: Secondary | ICD-10-CM | POA: Diagnosis present

## 2022-05-10 DIAGNOSIS — Z1231 Encounter for screening mammogram for malignant neoplasm of breast: Secondary | ICD-10-CM

## 2022-05-10 MED ORDER — VALACYCLOVIR HCL 1 G PO TABS: 1000.0000 mg | ORAL_TABLET | Freq: Two times a day (BID) | ORAL | 3 refills | Status: DC

## 2022-05-10 MED ORDER — CYANOCOBALAMIN 1000 MCG/ML IJ SOLN: 1000.0000 ug | INTRAMUSCULAR | 11 refills | Status: DC

## 2022-05-10 MED ORDER — ESTRADIOL 10 MCG VA TABS: 1.0000 | ORAL_TABLET | Freq: Every day | VAGINAL | 0 refills | Status: DC

## 2022-05-10 MED ORDER — ESTRADIOL 10 MCG VA TABS: 1.0000 | ORAL_TABLET | VAGINAL | 3 refills | Status: DC

## 2022-05-10 NOTE — Progress Notes (Addendum)
GYNECOLOGY ANNUAL PREVENTATIVE CARE ENCOUNTER NOTE  History:     Sylvia Daugherty is a 57 y.o. G34P0000 female here for a routine annual gynecologic exam.  Current complaints: vaginal dryness and pain with intercourse .   Denies abnormal vaginal bleeding, discharge, pelvic pain,  or other gynecologic concerns.      Social Relationship: Married  Living: with spouse  Work: Product manager Exercise: few x wk  Smoke/Alcohol/drug use: occasional alcohol use   Gynecologic History No LMP recorded. Patient is postmenopausal. Contraception: post menopausal status Last Pap: 04/19/2021. Results were: normal with negative HPV Last mammogram: 05/2021. Results were: normal  Obstetric History OB History  Gravida Para Term Preterm AB Living  3 3 0 0 0    SAB IAB Ectopic Multiple Live Births  0 0 0        # Outcome Date GA Lbr Len/2nd Weight Sex Delivery Anes PTL Lv  3 Para           2 Para           1 Para             Obstetric Comments  FIRST PREGNANCY 16  FIRST MENSTRUAL 10    Past Medical History:  Diagnosis Date   Actinic keratosis    Basal cell carcinoma 02/13/2017   L medial canthus   Diffuse cystic mastopathy 2011   RIGHT    History of basal cell carcinoma (BCC) 02/25/2019    left nasal root, nodular pattern    Hx of basal cell carcinoma    R ear concha   Hx of basal cell carcinoma    L lower nasal dorsum   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 and PASH   BREAST BIOPSY Right 2012   core with clip-neg   BREAST BIOPSY Right 2014   core with clip-neg   COLONOSCOPY  2013   DR.ELLIOTT   COLONOSCOPY WITH PROPOFOL N/A 03/10/2016   Procedure: COLONOSCOPY WITH PROPOFOL;  Surgeon: Midge Minium, MD;  Location: [facility];  Service: Endoscopy;  Laterality: N/A;   POLYPECTOMY  03/10/2016   Procedure: POLYPECTOMY;  Surgeon: Midge Minium, MD;   Location: [facility];  Service: Endoscopy;;   TUBAL LIGATION  1992    Current Outpatient Medications on File Prior to Visit  Medication Sig Dispense Refill   cyanocobalamin (,VITAMIN B-12,) 1000 MCG/ML injection Inject 1 mL (1,000 mcg total) into the muscle every 30 (thirty) days. 10 mL 11   cyclobenzaprine (FLEXERIL) 10 MG tablet Take 1 tablet every day by oral route at bedtime.     fluorouracil (EFUDEX) 5 % cream Apply topically 2 (two) times daily. Bid to chest for 7 days 30 g 1   Melatonin 5 MG TABS Take 2 tablets (10 mg total) by mouth at bedtime. 60 tablet 3   meloxicam (MOBIC) 15 MG tablet Take 1 tablet by mouth daily.     nitrofurantoin, macrocrystal-monohydrate, (MACROBID) 100 MG capsule Take 1 capsule (100 mg total) by mouth 2 (two) times daily. 14 capsule 0   pantoprazole (PROTONIX) 40 MG tablet Take by mouth.     valACYclovir (VALTREX) 1000 MG tablet Take 1 tablet (1,000 mg total) by mouth 2 (two) times daily. 60 tablet 3   No current facility-administered medications on file prior to visit.    No  Known Allergies  Social History:  reports that she has never smoked. She has never used smokeless tobacco. She reports current alcohol use. She reports that she does not use drugs.  Family History  Problem Relation Age of Onset   Diabetes Father    Breast cancer Neg Hx     The following portions of the patient's history were reviewed and updated as appropriate: allergies, current medications, past family history, past medical history, past social history, past surgical history and problem list.  Review of Systems Pertinent items noted in HPI and remainder of comprehensive ROS otherwise negative.  Physical Exam:  BP 122/74   Pulse 66   Ht 5\' 3"  (1.6 m)   Wt 137 lb 3.2 oz (62.2 kg)   BMI 24.30 kg/m  CONSTITUTIONAL: Well-developed, well-nourished female in no acute distress.  HENT:  Normocephalic, atraumatic, External right and left ear normal. Oropharynx is clear  and moist EYES: Conjunctivae and EOM are normal. Pupils are equal, round, and reactive to light. No scleral icterus.  NECK: Normal range of motion, supple, no masses.  Normal thyroid.  SKIN: Skin is warm and dry. No rash noted. Not diaphoretic. No erythema. No pallor. MUSCULOSKELETAL: Normal range of motion. No tenderness.  No cyanosis, clubbing, or edema.  2+ distal pulses. NEUROLOGIC: Alert and oriented to person, place, and time. Normal reflexes, muscle tone coordination.  PSYCHIATRIC: Normal mood and affect. Normal behavior. Normal judgment and thought content. CARDIOVASCULAR: Normal heart rate noted, regular rhythm RESPIRATORY: Clear to auscultation bilaterally. Effort and breath sounds normal, no problems with respiration noted. BREASTS: Symmetric in size. No masses, tenderness, skin changes, nipple drainage, or lymphadenopathy bilaterally.  ABDOMEN: Soft, no distention noted.  No tenderness, rebound or guarding.  PELVIC: Normal appearing external genitalia and urethral meatus; normal appearing vaginal mucosa and cervix.  No abnormal discharge noted.  Pap smear not due.  Normal uterine size, no other palpable masses, no uterine or adnexal tenderness.  .   Assessment and Plan:    1. Women's annual routine gynecological examination  Pap: not due  Mammogram : ordered  Labs: B12 , vaginal swab Refills: B12, valtrex , vagifem  Referral: none  Routine preventative health maintenance measures emphasized. Please refer to After Visit Summary for other counseling recommendations.      Doreene Burke, CNM [facility]  [facility],  [facility]

## 2022-05-10 NOTE — Addendum Note (Signed)
Addended by: Fonda Kinder on: 05/10/2022 04:52 PM   Modules accepted: Orders

## 2022-05-10 NOTE — Patient Instructions (Signed)
Preventive Care 40-59 Years Old, Female Preventive care refers to lifestyle choices and visits with your health care provider that can promote health and wellness. Preventive care visits are also called wellness exams. What can I expect for my preventive care visit? Counseling Your health care provider may ask you questions about your: Medical history, including: Past medical problems. Family medical history. Pregnancy history. Current health, including: Menstrual cycle. Method of birth control. Emotional well-being. Home life and relationship well-being. Sexual activity and sexual health. Lifestyle, including: Alcohol, nicotine or tobacco, and drug use. Access to firearms. Diet, exercise, and sleep habits. Work and work environment. Sunscreen use. Safety issues such as seatbelt and bike helmet use. Physical exam Your health care provider will check your: Height and weight. These may be used to calculate your BMI (body mass index). BMI is a measurement that tells if you are at a healthy weight. Waist circumference. This measures the distance around your waistline. This measurement also tells if you are at a healthy weight and may help predict your risk of certain diseases, such as type 2 diabetes and high blood pressure. Heart rate and blood pressure. Body temperature. Skin for abnormal spots. What immunizations do I need?  Vaccines are usually given at various ages, according to a schedule. Your health care provider will recommend vaccines for you based on your age, medical history, and lifestyle or other factors, such as travel or where you work. What tests do I need? Screening Your health care provider may recommend screening tests for certain conditions. This may include: Lipid and cholesterol levels. Diabetes screening. This is done by checking your blood sugar (glucose) after you have not eaten for a while (fasting). Pelvic exam and Pap test. Hepatitis B test. Hepatitis C  test. HIV (human immunodeficiency virus) test. STI (sexually transmitted infection) testing, if you are at risk. Lung cancer screening. Colorectal cancer screening. Mammogram. Talk with your health care provider about when you should start having regular mammograms. This may depend on whether you have a family history of breast cancer. BRCA-related cancer screening. This may be done if you have a family history of breast, ovarian, tubal, or peritoneal cancers. Bone density scan. This is done to screen for osteoporosis. Talk with your health care provider about your test results, treatment options, and if necessary, the need for more tests. Follow these instructions at home: Eating and drinking  Eat a diet that includes fresh fruits and vegetables, whole grains, lean protein, and low-fat dairy products. Take vitamin and mineral supplements as recommended by your health care provider. Do not drink alcohol if: Your health care provider tells you not to drink. You are pregnant, may be pregnant, or are planning to become pregnant. If you drink alcohol: Limit how much you have to 0-1 drink a day. Know how much alcohol is in your drink. In the [place] one drink equals one 12 oz bottle of beer (355 mL), one 5 oz glass of wine (148 mL), or one 1 oz glass of hard liquor (44 mL). Lifestyle Brush your teeth every morning and night with fluoride toothpaste. Floss one time each day. Exercise for at least 30 minutes 5 or more days each week. Do not use any products that contain nicotine or tobacco. These products include cigarettes, chewing tobacco, and vaping devices, such as e-cigarettes. If you need help quitting, ask your health care provider. Do not use drugs. If you are sexually active, practice safe sex. Use a condom or other form of protection to   prevent STIs. If you do not wish to become pregnant, use a form of birth control. If you plan to become pregnant, see your health care provider for a  prepregnancy visit. Take aspirin only as told by your health care provider. Make sure that you understand how much to take and what form to take. Work with your health care provider to find out whether it is safe and beneficial for you to take aspirin daily. Find healthy ways to manage stress, such as: Meditation, yoga, or listening to music. Journaling. Talking to a trusted person. Spending time with friends and family. Minimize exposure to UV radiation to reduce your risk of skin cancer. Safety Always wear your seat belt while driving or riding in a vehicle. Do not drive: If you have been drinking alcohol. Do not ride with someone who has been drinking. When you are tired or distracted. While texting. If you have been using any mind-altering substances or drugs. Wear a helmet and other protective equipment during sports activities. If you have firearms in your house, make sure you follow all gun safety procedures. Seek help if you have been physically or sexually abused. What's next? Visit your health care provider once a year for an annual wellness visit. Ask your health care provider how often you should have your eyes and teeth checked. Stay up to date on all vaccines. This information is not intended to replace advice given to you by your health care provider. Make sure you discuss any questions you have with your health care provider. Document Revised: 06/23/2020 Document Reviewed: 06/23/2020 Elsevier Patient Education  2023 [employer]  

## 2022-05-11 LAB — VITAMIN B12: Vitamin B-12: 357 pg/mL (ref 232–1245)

## 2022-05-12 ENCOUNTER — Other Ambulatory Visit: Payer: Self-pay | Admitting: Certified Nurse Midwife

## 2022-05-12 ENCOUNTER — Encounter: Payer: Self-pay | Admitting: Certified Nurse Midwife

## 2022-05-12 LAB — CERVICOVAGINAL ANCILLARY ONLY
Bacterial Vaginitis (gardnerella): NEGATIVE
Candida Glabrata: NEGATIVE
Candida Vaginitis: NEGATIVE
Chlamydia: POSITIVE — AB
Comment: NEGATIVE
Comment: NEGATIVE
Comment: NEGATIVE
Comment: NEGATIVE
Comment: NEGATIVE
Comment: NORMAL
Neisseria Gonorrhea: NEGATIVE
Trichomonas: NEGATIVE

## 2022-05-12 MED ORDER — DOXYCYCLINE MONOHYDRATE 100 MG PO TABS: 100.0000 mg | ORAL_TABLET | Freq: Two times a day (BID) | ORAL | 0 refills | Status: AC

## 2022-05-16 ENCOUNTER — Other Ambulatory Visit: Payer: Self-pay | Admitting: Certified Nurse Midwife

## 2022-05-16 MED ORDER — ESTRADIOL 10 MCG VA TABS: 1.0000 | ORAL_TABLET | VAGINAL | 5 refills | Status: DC

## 2022-05-20 ENCOUNTER — Telehealth: Payer: Self-pay | Admitting: Certified Nurse Midwife

## 2022-05-20 NOTE — Telephone Encounter (Signed)
Called pt to discuss results and answer questions. No answer. Left message.   Doreene Burke, CNM

## 2022-05-23 ENCOUNTER — Telehealth: Payer: Self-pay | Admitting: Certified Nurse Midwife

## 2022-05-23 NOTE — Telephone Encounter (Signed)
Attempted to call in regards to pt question about recent lab results. This is the second attempt at returning her call. Message was left .   Doreene Burke, CNM

## 2022-05-25 ENCOUNTER — Telehealth: Payer: Self-pay | Admitting: Certified Nurse Midwife

## 2022-05-25 NOTE — Telephone Encounter (Signed)
Attempted to call [facility] for the 3rd time , Left message for her to call office if she still has questions regarding lab results.   Doreene Burke, CNM

## 2022-06-13 ENCOUNTER — Other Ambulatory Visit (HOSPITAL_COMMUNITY)
Admission: RE | Admit: 2022-06-13 | Discharge: 2022-06-13 | Disposition: A | Discharge status: Home / Self Care | Payer: BC Managed Care – PPO | Source: Ambulatory Visit | Attending: Certified Nurse Midwife | Admitting: Certified Nurse Midwife

## 2022-06-13 ENCOUNTER — Ambulatory Visit: Payer: BC Managed Care – PPO

## 2022-06-13 VITALS — BP 132/78 | Ht 63.0 in | Wt 139.0 lb

## 2022-06-13 DIAGNOSIS — N898 Other specified noninflammatory disorders of vagina: Secondary | ICD-10-CM

## 2022-06-13 DIAGNOSIS — Z113 Encounter for screening for infections with a predominantly sexual mode of transmission: Secondary | ICD-10-CM | POA: Insufficient documentation

## 2022-06-13 NOTE — Progress Notes (Signed)
    NURSE VISIT NOTE  Subjective:    Patient ID: Sylvia Daugherty, female    DOB: 1965-06-09, 57 y.o.   MRN: [id number]  HPI  Patient is a 57 y.o. G52P0000 female who presents for treatment of care from 05/10/22  clear vaginal discharge for and some odor for a couple weeks.Denies abnormal vaginal bleeding or significant pelvic pain or fever. denies . Patient denies history of known exposure to STD.   Objective:    BP 132/78   Ht 5\' 3"  (1.6 m)   Wt 139 lb (63 kg)   BMI 24.62 kg/m     Assessment:   1. Screening for STDs (sexually transmitted diseases)   2. Vaginal odor      Plan:   GC and chlamydia DNA  probe sent to lab. Treatment: abstain from coitus during course of treatment ROV prn if symptoms persist or worsen.   Loney Laurence, CMA

## 2022-06-14 LAB — CERVICOVAGINAL ANCILLARY ONLY
Bacterial Vaginitis (gardnerella): NEGATIVE
Candida Glabrata: NEGATIVE
Candida Vaginitis: NEGATIVE
Chlamydia: NEGATIVE
Comment: NEGATIVE
Comment: NEGATIVE
Comment: NEGATIVE
Comment: NEGATIVE
Comment: NEGATIVE
Comment: NORMAL
Neisseria Gonorrhea: NEGATIVE
Trichomonas: NEGATIVE

## 2022-06-19 NOTE — Telephone Encounter (Signed)
Patient was seen 6/4 for TOC

## 2022-06-20 ENCOUNTER — Ambulatory Visit
Admission: RE | Admit: 2022-06-20 | Discharge: 2022-06-20 | Disposition: A | Discharge status: Home / Self Care | Payer: BC Managed Care – PPO | Source: Ambulatory Visit | Attending: Certified Nurse Midwife | Admitting: Certified Nurse Midwife

## 2022-06-20 DIAGNOSIS — Z01419 Encounter for gynecological examination (general) (routine) without abnormal findings: Secondary | ICD-10-CM

## 2022-06-20 DIAGNOSIS — Z1231 Encounter for screening mammogram for malignant neoplasm of breast: Secondary | ICD-10-CM | POA: Insufficient documentation

## 2022-10-30 ENCOUNTER — Encounter: Payer: Self-pay | Admitting: Dermatology

## 2022-10-30 ENCOUNTER — Ambulatory Visit (INDEPENDENT_AMBULATORY_CARE_PROVIDER_SITE_OTHER): Payer: BC Managed Care – PPO | Admitting: Dermatology

## 2022-10-30 VITALS — BP 103/70 | HR 79

## 2022-10-30 DIAGNOSIS — W908XXA Exposure to other nonionizing radiation, initial encounter: Secondary | ICD-10-CM | POA: Diagnosis not present

## 2022-10-30 DIAGNOSIS — L820 Inflamed seborrheic keratosis: Secondary | ICD-10-CM | POA: Diagnosis not present

## 2022-10-30 DIAGNOSIS — Z872 Personal history of diseases of the skin and subcutaneous tissue: Secondary | ICD-10-CM

## 2022-10-30 DIAGNOSIS — L720 Epidermal cyst: Secondary | ICD-10-CM

## 2022-10-30 DIAGNOSIS — Z1283 Encounter for screening for malignant neoplasm of skin: Secondary | ICD-10-CM | POA: Diagnosis not present

## 2022-10-30 DIAGNOSIS — D229 Melanocytic nevi, unspecified: Secondary | ICD-10-CM

## 2022-10-30 DIAGNOSIS — D2272 Melanocytic nevi of left lower limb, including hip: Secondary | ICD-10-CM

## 2022-10-30 DIAGNOSIS — L729 Follicular cyst of the skin and subcutaneous tissue, unspecified: Secondary | ICD-10-CM

## 2022-10-30 DIAGNOSIS — L814 Other melanin hyperpigmentation: Secondary | ICD-10-CM | POA: Diagnosis not present

## 2022-10-30 DIAGNOSIS — Z85828 Personal history of other malignant neoplasm of skin: Secondary | ICD-10-CM

## 2022-10-30 DIAGNOSIS — L738 Other specified follicular disorders: Secondary | ICD-10-CM

## 2022-10-30 DIAGNOSIS — L578 Other skin changes due to chronic exposure to nonionizing radiation: Secondary | ICD-10-CM | POA: Diagnosis not present

## 2022-10-30 DIAGNOSIS — D225 Melanocytic nevi of trunk: Secondary | ICD-10-CM

## 2022-10-30 DIAGNOSIS — L821 Other seborrheic keratosis: Secondary | ICD-10-CM

## 2022-10-30 DIAGNOSIS — D2262 Melanocytic nevi of left upper limb, including shoulder: Secondary | ICD-10-CM

## 2022-10-30 NOTE — Patient Instructions (Addendum)

## 2022-10-30 NOTE — Progress Notes (Signed)
Follow-Up Visit   Subjective  Sylvia Daugherty is a 57 y.o. female who presents for the following: Skin Cancer Screening and Full Body Skin Exam hx ak, hx of bcc, hx of nevi Dry patches on back of left thigh, top of left hand, top left shoulder.    The patient presents for Total-Body Skin Exam (TBSE) for skin cancer screening and mole check. The patient has spots, moles and lesions to be evaluated, some may be new or changing and the patient may have concern these could be cancer.   The following portions of the chart were reviewed this encounter and updated as appropriate: medications, allergies, medical history  Review of Systems:  No other skin or systemic complaints except as noted in HPI or Assessment and Plan.  Objective  Well appearing patient in no apparent distress; mood and affect are within normal limits.  A full examination was performed including scalp, head, eyes, ears, nose, lips, neck, chest, axillae, abdomen, back, buttocks, bilateral upper extremities, bilateral lower extremities, hands, feet, fingers, toes, fingernails, and toenails. All findings within normal limits unless otherwise noted below.   Relevant physical exam findings are noted in the Assessment and Plan.  left hand x 1, left anterior shoulder x 1, left chest x 1 (3) Pink tan scaly thin papules    Assessment & Plan   SKIN CANCER SCREENING PERFORMED TODAY.  ACTINIC DAMAGE - Chronic condition, secondary to cumulative UV/sun exposure - diffuse scaly erythematous macules with underlying dyspigmentation - Recommend daily broad spectrum sunscreen SPF 30+ to sun-exposed areas, reapply every 2 hours as needed.  - Staying in the shade or wearing long sleeves, sun glasses (UVA+UVB protection) and wide brim hats (4-inch brim around the entire circumference of the hat) are also recommended for sun protection.  - Call for new or changing lesions.  LENTIGINES, SEBORRHEIC KERATOSES, HEMANGIOMAS - Benign normal  skin lesions - Benign-appearing - Call for any changes   Lentigines Mid sternum   Exam: 0.6cm speckled brown macule darker edge, stable from previous visit, photo compared   Benign-appearing. Stable compared to previous visit. Observation.  Call clinic for new or changing moles.  Recommend daily use of broad spectrum spf 30+ sunscreen to sun-exposed areas.    MELANOCYTIC NEVI - Tan-brown and/or pink-flesh-colored symmetric macules and papules - Benign appearing on exam today - Observation - Call clinic for new or changing moles - Recommend daily use of broad spectrum spf 30+ sunscreen to sun-exposed areas.   Nevus (3)  L lat foot 3.52mm brown macule   left lateral mid upper back  3.0 x 2.77mm med dark brown macule   L post shoulder 2.56mm brown macule   Benign-appearing.  Observation.  Call clinic for new or changing moles.  Recommend daily use of broad spectrum spf 30+ sunscreen to sun-exposed areas.     Sebaceous hyperplasia L lower nasal dorsum  Exam 3.28mm indistinct flesh yellow lobulated pap with central dell    Benign-appearing. Stable compared to previous visit. Observation.  Recommend daily use of broad spectrum spf 30+ sunscreen to sun-exposed areas.     EPIDERMAL INCLUSION CYST Exam: 6 mm firm Subcutaneous nodule at right upper abdomen   Benign-appearing. Exam most consistent with an epidermal inclusion cyst. Discussed that a cyst is a benign growth that can grow over time and sometimes get irritated or inflamed. Recommend observation if it is not bothersome. Discussed option of surgical excision to remove it if it is growing, symptomatic, or other changes noted.  Please call for new or changing lesions so they can be evaluated.  History of Basal Cell Carcinoma of the Skin - No evidence of recurrence today- L medial canthus, L nasal root, R ear concha, L  lower nasal dorsum - Recommend regular full body skin exams - Recommend daily broad spectrum sunscreen SPF  30+ to sun-exposed areas, reapply every 2 hours as needed.  - Call if any new or changing lesions are noted between office visits   Inflamed seborrheic keratosis (3) left hand x 1, left anterior shoulder x 1, left chest x 1  Vs Aks, Symptomatic, irritating, patient would like treated.    Destruction of lesion - left hand x 1, left anterior shoulder x 1, left chest x 1 (3)  Destruction method: cryotherapy   Informed consent: discussed and consent obtained   Lesion destroyed using liquid nitrogen: Yes   Region frozen until ice ball extended beyond lesion: Yes   Outcome: patient tolerated procedure well with no complications   Post-procedure details: wound care instructions given   Additional details:  Prior to procedure, discussed risks of blister formation, small wound, skin dyspigmentation, or rare scar following cryotherapy. Recommend Vaseline ointment to treated areas while healing.    Return in about 1 year (around 10/30/2023) for TBSE.  I, Asher Muir, CMA, am acting as scribe for Willeen Niece, MD.   Documentation: I have reviewed the above documentation for accuracy and completeness, and I agree with the above.  Willeen Niece, MD

## 2022-11-16 ENCOUNTER — Encounter: Payer: Self-pay | Admitting: Certified Nurse Midwife

## 2022-11-16 NOTE — Progress Notes (Unsigned)
    NURSE VISIT NOTE  Subjective:    Patient ID: Sylvia Daugherty, female    DOB: 1965-03-26, 57 y.o.   MRN: [id number]  HPI  Patient is a 57 y.o. G31P0000 female who presents for  repeat testing for G/C, patient tested positive for Chlamydia on 05/10/22, she denies any vaginal symptoms today or new sexual partner.  Denies abnormal vaginal bleeding or significant pelvic pain or fever. reports urinary urgency. Patient has history of known exposure to STD.   Objective:    BP 110/70   Pulse 88   Wt 124 lb 11.2 oz (56.6 kg)   BMI 22.09 kg/m     Assessment:   1. Screening examination for STD (sexually transmitted disease)   2. Urinary urgency     rule out GC or chlamydia and nonspecific vaginitis  Plan:   GC and chlamydia DNA  probe sent to lab. Treatment: abstain from coitus during course of treatment and await results of culture and vaginal swab ROV prn if symptoms persist or worsen.   Fonda Kinder, CMA

## 2022-11-17 ENCOUNTER — Ambulatory Visit: Payer: BC Managed Care – PPO

## 2022-11-17 ENCOUNTER — Other Ambulatory Visit (HOSPITAL_COMMUNITY)
Admission: RE | Admit: 2022-11-17 | Discharge: 2022-11-17 | Disposition: A | Discharge status: Home / Self Care | Payer: BC Managed Care – PPO | Source: Ambulatory Visit | Attending: Obstetrics and Gynecology | Admitting: Obstetrics and Gynecology

## 2022-11-17 VITALS — BP 110/70 | HR 88 | Wt 124.7 lb

## 2022-11-17 DIAGNOSIS — Z113 Encounter for screening for infections with a predominantly sexual mode of transmission: Secondary | ICD-10-CM | POA: Insufficient documentation

## 2022-11-17 DIAGNOSIS — R3915 Urgency of urination: Secondary | ICD-10-CM | POA: Diagnosis not present

## 2022-11-17 LAB — POCT URINALYSIS DIPSTICK
Bilirubin, UA: NEGATIVE
Glucose, UA: NEGATIVE
Ketones, UA: NEGATIVE
Nitrite, UA: NEGATIVE
Protein, UA: NEGATIVE
Spec Grav, UA: <=1.005 — AB (ref 1.010–1.025)
Urobilinogen, UA: 0.2 U/dL
pH, UA: 6 (ref 5.0–8.0)

## 2022-11-17 NOTE — Patient Instructions (Signed)
Safe Sex Practicing safe sex means taking steps before and during sex to reduce your risk of: Getting an STI (sexually transmitted infection). Giving your partner an STI. Unwanted or unplanned pregnancy. How to practice safe sex Ways you can practice safe sex  Limit your sexual partners to only one partner who is having sex with only you. Avoid using alcohol and drugs before having sex. Alcohol and drugs can affect your judgment. Before having sex with a new partner: Talk to your partner about past partners, past STIs, and drug use. Get screened for STIs and discuss the results with your partner. Ask your partner to get screened too. Check your body regularly for sores, blisters, rashes, or unusual discharge. If you notice any of these problems, visit your health care provider. Avoid sexual contact if you have symptoms of an infection or you are being treated for an STI. While having sex, use a condom. Make sure to: Use a condom every time you have vaginal, oral, or anal sex. Both females and males should wear condoms during oral sex. Keep condoms in place from the beginning to the end of sexual activity. Use a latex condom, if possible. Latex condoms offer the best protection. Use only water-based lubricants with a condom. Using petroleum-based lubricants or oils will weaken the condom and increase the chance that it will break. Ways your health care provider can help you practice safe sex  See your health care provider for regular screenings, exams, and tests for STIs. Talk with your health care provider about what kind of birth control (contraception) is best for you. Get vaccinated against hepatitis B and human papillomavirus (HPV). If you are at risk of being infected with HIV (human immunodeficiency virus), talk with your health care provider about taking a prescription medicine to prevent HIV infection. You are at risk for HIV if you: Are a man who has sex with other men. Are  sexually active with more than one partner. Take drugs by injection. Have a sex partner who has HIV. Have unprotected sex. Have sex with someone who has sex with both men and women. Have had an STI. Follow these instructions at home: Take over-the-counter and prescription medicines only as told by your health care provider. Keep all follow-up visits. This is important. Where to find more information Centers for Disease Control and Prevention: www.cdc.gov Planned Parenthood: www.plannedparenthood.org Office on Women's Health: www.womenshealth.gov Summary Practicing safe sex means taking steps before and during sex to reduce your risk getting an STI, giving your partner an STI, and having an unwanted or unplanned pregnancy. Before having sex with a new partner, talk to your partner about past partners, past STIs, and drug use. Use a condom every time you have vaginal, oral, or anal sex. Both females and males should wear condoms during oral sex. Check your body regularly for sores, blisters, rashes, or unusual discharge. If you notice any of these problems, visit your health care provider. See your health care provider for regular screenings, exams, and tests for STIs. This information is not intended to replace advice given to you by your health care provider. Make sure you discuss any questions you have with your health care provider. Document Revised: 06/02/2019 Document Reviewed: 06/02/2019 Elsevier Patient Education  2024 [employer]  

## 2022-11-21 LAB — CERVICOVAGINAL ANCILLARY ONLY
Bacterial Vaginitis (gardnerella): NEGATIVE
Candida Glabrata: NEGATIVE
Candida Vaginitis: NEGATIVE
Chlamydia: NEGATIVE
Comment: NEGATIVE
Comment: NEGATIVE
Comment: NEGATIVE
Comment: NEGATIVE
Comment: NEGATIVE
Comment: NORMAL
Neisseria Gonorrhea: NEGATIVE
Trichomonas: NEGATIVE

## 2022-11-21 LAB — URINE CULTURE

## 2022-12-04 MED ORDER — CIPROFLOXACIN HCL 250 MG PO TABS: 250.0000 mg | ORAL_TABLET | Freq: Two times a day (BID) | ORAL | 0 refills | Status: DC

## 2023-05-25 ENCOUNTER — Ambulatory Visit: Admitting: Certified Nurse Midwife

## 2023-05-28 NOTE — Patient Instructions (Signed)
 Preventive Care 39-60 Years Old, Female Preventive care refers to lifestyle choices and visits with your health care provider that can promote health and wellness. Preventive care visits are also called wellness exams. What can I expect for my preventive care visit? Counseling Your health care provider may ask you questions about your: Medical history, including: Past medical problems. Family medical history. Pregnancy history. Current health, including: Menstrual cycle. Method of birth control. Emotional well-being. Home life and relationship well-being. Sexual activity and sexual health. Lifestyle, including: Alcohol, nicotine or tobacco, and drug use. Access to firearms. Diet, exercise, and sleep habits. Work and work Astronomer. Sunscreen use. Safety issues such as seatbelt and bike helmet use. Physical exam Your health care provider will check your: Height and weight. These may be used to calculate your BMI (body mass index). BMI is a measurement that tells if you are at a healthy weight. Waist circumference. This measures the distance around your waistline. This measurement also tells if you are at a healthy weight and may help predict your risk of certain diseases, such as type 2 diabetes and high blood pressure. Heart rate and blood pressure. Body temperature. Skin for abnormal spots. What immunizations do I need?  Vaccines are usually given at various ages, according to a schedule. Your health care provider will recommend vaccines for you based on your age, medical history, and lifestyle or other factors, such as travel or where you work. What tests do I need? Screening Your health care provider may recommend screening tests for certain conditions. This may include: Lipid and cholesterol levels. Diabetes screening. This is done by checking your blood sugar (glucose) after you have not eaten for a while (fasting). Pelvic exam and Pap test. Hepatitis B test. Hepatitis C  test. HIV (human immunodeficiency virus) test. STI (sexually transmitted infection) testing, if you are at risk. Lung cancer screening. Colorectal cancer screening. Mammogram. Talk with your health care provider about when you should start having regular mammograms. This may depend on whether you have a family history of breast cancer. BRCA-related cancer screening. This may be done if you have a family history of breast, ovarian, tubal, or peritoneal cancers. Bone density scan. This is done to screen for osteoporosis. Talk with your health care provider about your test results, treatment options, and if necessary, the need for more tests. Follow these instructions at home: Eating and drinking  Eat a diet that includes fresh fruits and vegetables, whole grains, lean protein, and low-fat dairy products. Take vitamin and mineral supplements as recommended by your health care provider. Do not drink alcohol if: Your health care provider tells you not to drink. You are pregnant, may be pregnant, or are planning to become pregnant. If you drink alcohol: Limit how much you have to 0-1 drink a day. Know how much alcohol is in your drink. In the [place] one drink equals one 12 oz bottle of beer (355 mL), one 5 oz glass of wine (148 mL), or one 1 oz glass of hard liquor (44 mL). Lifestyle Brush your teeth every morning and night with fluoride toothpaste. Floss one time each day. Exercise for at least 30 minutes 5 or more days each week. Do not use any products that contain nicotine or tobacco. These products include cigarettes, chewing tobacco, and vaping devices, such as e-cigarettes. If you need help quitting, ask your health care provider. Do not use drugs. If you are sexually active, practice safe sex. Use a condom or other form of protection to  prevent STIs. If you do not wish to become pregnant, use a form of birth control. If you plan to become pregnant, see your health care provider for a  prepregnancy visit. Take aspirin only as told by your health care provider. Make sure that you understand how much to take and what form to take. Work with your health care provider to find out whether it is safe and beneficial for you to take aspirin daily. Find healthy ways to manage stress, such as: Meditation, yoga, or listening to music. Journaling. Talking to a trusted person. Spending time with friends and family. Minimize exposure to UV radiation to reduce your risk of skin cancer. Safety Always wear your seat belt while driving or riding in a vehicle. Do not drive: If you have been drinking alcohol. Do not ride with someone who has been drinking. When you are tired or distracted. While texting. If you have been using any mind-altering substances or drugs. Wear a helmet and other protective equipment during sports activities. If you have firearms in your house, make sure you follow all gun safety procedures. Seek help if you have been physically or sexually abused. What's next? Visit your health care provider once a year for an annual wellness visit. Ask your health care provider how often you should have your eyes and teeth checked. Stay up to date on all vaccines. This information is not intended to replace advice given to you by your health care provider. Make sure you discuss any questions you have with your health care provider. Document Revised: 06/23/2020 Document Reviewed: 06/23/2020 Elsevier Patient Education  2024 [employer] Breast Self-Awareness Breast self-awareness is knowing how your breasts look and feel. You need to: Check your breasts on a regular basis. Tell your doctor about any changes. Become familiar with the look and feel of your breasts. This can help you catch a breast problem while it is still small and can be treated. You should do breast self-exams even if you have breast implants. What you need: A mirror. A well-lit room. A pillow or other  soft object. How to do a breast self-exam Follow these steps to do a breast self-exam: Look for changes  Take off all the clothes above your waist. Stand in front of a mirror in a room with good lighting. Put your hands down at your sides. Compare your breasts in the mirror. Look for any difference between them, such as: A difference in shape. A difference in size. Wrinkles, dips, and bumps in one breast and not the other. Look at each breast for changes in the skin, such as: Redness. Scaly areas. Skin that has gotten thicker. Dimpling. Open sores (ulcers). Look for changes in your nipples, such as: Fluid coming out of a nipple. Fluid around a nipple. Bleeding. Dimpling. Redness. A nipple that looks pushed in (retracted), or that has changed position. Feel for changes Lie on your back. Feel each breast. To do this: Pick a breast to feel. Place a pillow under the shoulder closest to that breast. Put the arm closest to that breast behind your head. Feel the nipple area of that breast using the hand of your other arm. Feel the area with the pads of your three middle fingers by making small circles with your fingers. Use light, medium, and firm pressure. Continue the overlapping circles, moving downward over the breast. Keep making circles with your fingers. Stop when you feel your ribs. Start making circles with your fingers again, this time going  upward until you reach your collarbone. Then, make circles outward across your breast and into your armpit area. Squeeze your nipple. Check for discharge and lumps. Repeat these steps to check your other breast. Sit or stand in the tub or shower. With soapy water on your skin, feel each breast the same way you did when you were lying down. Write down what you find Writing down what you find can help you remember what to tell your doctor. Write down: What is normal for each breast. Any changes you find in each breast. These  include: The kind of changes you find. A tender or painful breast. Any lump you find. Write down its size and where it is. When you last had your monthly period (menstrual cycle). General tips If you are breastfeeding, the best time to check your breasts is after you feed your baby or after you use a breast pump. If you get monthly bleeding, the best time to check your breasts is 5-7 days after your monthly cycle ends. With time, you will become comfortable with the self-exam. You will also start to know if there are changes in your breasts. Contact a doctor if: You see a change in the shape or size of your breasts or nipples. You see a change in the skin of your breast or nipples, such as red or scaly skin. You have fluid coming from your nipples that is not normal. You find a new lump or thick area. You have breast pain. You have any concerns about your breast health. Summary Breast self-awareness includes looking for changes in your breasts and feeling for changes within your breasts. You should do breast self-awareness in front of a mirror in a well-lit room. If you get monthly periods (menstrual cycles), the best time to check your breasts is 5-7 days after your period ends. Tell your doctor about any changes you see in your breasts. Changes include changes in size, changes on the skin, painful or tender breasts, or fluid from your nipples that is not normal. This information is not intended to replace advice given to you by your health care provider. Make sure you discuss any questions you have with your health care provider. Document Revised: 06/02/2021 Document Reviewed: 10/28/2020 Elsevier Patient Education  2024 [employer].

## 2023-05-28 NOTE — Progress Notes (Signed)
 GYNECOLOGY ANNUAL PREVENTATIVE CARE ENCOUNTER NOTE  History:      Sylvia Daugherty is a 58 y.o. G74P0000 female here for a routine annual gynecologic exam.  Current complaints: none.   Denies abnormal vaginal bleeding, discharge, pelvic pain, problems with intercourse or other gynecologic concerns.     Social Relationship: married  Living:with spouse  Work: Product manager Exercise: 4-5 x week Smoke/Alcohol/drug use: occasional alcohol use  Gynecologic History No LMP recorded. Patient is postmenopausal. Contraception: post menopausal status Last Pap: 04/19/2021. Results were: normal with negative HPV Last mammogram: 06/20/2019. Results were: normal  Obstetric History OB History  Gravida Para Term Preterm AB Living  3 3 0 0 0   SAB IAB Ectopic Multiple Live Births  0 0 0      # Outcome Date GA Lbr Len/2nd Weight Sex Type Anes PTL Lv  3 Para           2 Para           1 Para             Obstetric Comments  FIRST PREGNANCY 16  FIRST MENSTRUAL 10    Past Medical History:  Diagnosis Date   Actinic keratosis    Basal cell carcinoma 02/13/2017   L medial canthus   Diffuse cystic mastopathy 2011   RIGHT    History of basal cell carcinoma (BCC) 02/25/2019    left nasal root, nodular pattern    Hx of basal cell carcinoma    R ear concha   Hx of basal cell carcinoma    L lower nasal dorsum   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 and PASH   BREAST BIOPSY Right 2012   core with clip-neg   BREAST BIOPSY Right 2014   core with clip-neg   COLONOSCOPY  2013   DR.[email or web address]   COLONOSCOPY WITH PROPOFOL  N/A 03/10/2016   Procedure: COLONOSCOPY WITH PROPOFOL ;  Surgeon: Marnee Sink, MD;  Location[facility];  Service: Endoscopy;  Laterality: N/A;   POLYPECTOMY  03/10/2016   Procedure: POLYPECTOMY;  Surgeon: Marnee Sink, MD;  Location: [facility];  Service: Endoscopy;;   TUBAL LIGATION  1992    Current Outpatient Medications on File Prior to Visit  Medication Sig Dispense Refill   ciprofloxacin  (CIPRO ) 250 MG tablet Take 1 tablet (250 mg total) by mouth 2 (two) times daily. 6 tablet 0   cyanocobalamin  (VITAMIN B12) 1000 MCG/ML injection Inject 1 mL (1,000 mcg total) into the muscle every 30 (thirty) days. 10 mL 11   cyclobenzaprine (FLEXERIL) 10 MG tablet Take 1 tablet every day by oral route at bedtime.     Estradiol  (YUVAFEM ) 10 MCG TABS vaginal tablet Place 1 tablet (10 mcg total) vaginally 2 (two) times a week. Start twice weekly after completion of the 10 mcg daily dosing x 14 days 8 tablet 5   lubiprostone (AMITIZA) 8 MCG capsule Take by mouth.     Melatonin 5 MG TABS Take 2 tablets (10 mg total) by mouth at bedtime. 60 tablet 3   ondansetron (ZOFRAN-ODT) 4 MG disintegrating tablet 4 mg every 8 (eight) hours as needed.     pantoprazole (PROTONIX) 40 MG tablet Take by mouth.     valACYclovir  (VALTREX ) 1000 MG tablet Take 1 tablet (1,000 mg total) by  mouth 2 (two) times daily. 60 tablet 3   YUVAFEM  10 MCG TABS vaginal tablet PLACE 1 TABLET (10 MCG TOTAL) VAGINALLY AT BEDTIME FOR 14 DAYS. 8 tablet 0   fluorouracil  (EFUDEX ) 5 % cream Apply topically 2 (two) times daily. Bid to chest for 7 days 30 g 1   meloxicam (MOBIC) 15 MG tablet Take 1 tablet by mouth daily.     No current facility-administered medications on file prior to visit.    No Known Allergies  Social History:  reports that she has never smoked. She has never used smokeless tobacco. She reports current alcohol use. She reports that she does not use drugs.  Family History  Problem Relation Age of Onset   Diabetes Father    Breast cancer Neg Hx     The following portions of the patient's history were reviewed and updated as appropriate: allergies, current medications, past family history, past medical history, past social history, past surgical history  and problem list.  Review of Systems Pertinent items noted in HPI and remainder of comprehensive ROS otherwise negative.  Physical Exam:  BP 107/69   Pulse 80   Ht 5\' 3"  (1.6 m)   Wt 101 lb 11.2 oz (46.1 kg)   BMI 18.02 kg/m  CONSTITUTIONAL: Well-developed, well-nourished female in no acute distress.  HENT:  Normocephalic, atraumatic, External right and left ear normal. Oropharynx is clear and moist EYES: Conjunctivae and EOM are normal. Pupils are equal, round, and reactive to light. No scleral icterus.  NECK: Normal range of motion, supple, no masses.  Normal thyroid .  SKIN: Skin is warm and dry. No rash noted. Not diaphoretic. No erythema. No pallor. MUSCULOSKELETAL: Normal range of motion. No tenderness.  No cyanosis, clubbing, or edema.  2+ distal pulses. NEUROLOGIC: Alert and oriented to person, place, and time. Normal reflexes, muscle tone coordination.  PSYCHIATRIC: Normal mood and affect. Normal behavior. Normal judgment and thought content. CARDIOVASCULAR: Normal heart rate noted, regular rhythm RESPIRATORY: Clear to auscultation bilaterally. Effort and breath sounds normal, no problems with respiration noted. BREASTS: Symmetric in size. No masses, tenderness, skin changes, nipple drainage, or lymphadenopathy bilaterally.  ABDOMEN: Soft, no distention noted.  No tenderness, rebound or guarding.  PELVIC: Normal appearing external genitalia and urethral meatus; normal appearing vaginal mucosa and cervix.  No abnormal discharge noted.  Pap smear not due.  Normal uterine size, no other palpable masses, no uterine or adnexal tenderness.  .   Assessment and Plan:    1. Women's annual routine gynecological examination (Primary)  Pap: not due  Mammogram : ordered Colonoscopy 2018 Labs: CBC/CMP, Vit D, Vit B12, TSH, Lipid Refills:valtrex  , vitamin B12, Yuvafem  Referral: none  Routine preventative health maintenance measures emphasized. Please refer to After Visit Summary for  other counseling recommendations.      Alise Appl, CNM [facility]  [facility],  [facility]

## 2023-05-29 ENCOUNTER — Ambulatory Visit (INDEPENDENT_AMBULATORY_CARE_PROVIDER_SITE_OTHER): Payer: Self-pay | Admitting: Certified Nurse Midwife

## 2023-05-29 ENCOUNTER — Other Ambulatory Visit (HOSPITAL_COMMUNITY)
Admission: RE | Admit: 2023-05-29 | Discharge: 2023-05-29 | Disposition: A | Discharge status: Home / Self Care | Source: Ambulatory Visit | Attending: Certified Nurse Midwife | Admitting: Certified Nurse Midwife

## 2023-05-29 ENCOUNTER — Encounter: Payer: Self-pay | Admitting: Certified Nurse Midwife

## 2023-05-29 VITALS — BP 107/69 | HR 80 | Ht 63.0 in | Wt 101.7 lb

## 2023-05-29 DIAGNOSIS — Z1231 Encounter for screening mammogram for malignant neoplasm of breast: Secondary | ICD-10-CM

## 2023-05-29 DIAGNOSIS — Z13220 Encounter for screening for lipoid disorders: Secondary | ICD-10-CM

## 2023-05-29 DIAGNOSIS — Z113 Encounter for screening for infections with a predominantly sexual mode of transmission: Secondary | ICD-10-CM | POA: Insufficient documentation

## 2023-05-29 DIAGNOSIS — Z01419 Encounter for gynecological examination (general) (routine) without abnormal findings: Secondary | ICD-10-CM

## 2023-05-29 DIAGNOSIS — Z130 Encounter for screening for diseases of the blood and blood-forming organs and certain disorders involving the immune mechanism: Secondary | ICD-10-CM

## 2023-05-29 DIAGNOSIS — Z131 Encounter for screening for diabetes mellitus: Secondary | ICD-10-CM

## 2023-05-29 DIAGNOSIS — Z1329 Encounter for screening for other suspected endocrine disorder: Secondary | ICD-10-CM

## 2023-05-29 MED ORDER — ESTRADIOL 10 MCG VA TABS
1.0000 | ORAL_TABLET | VAGINAL | 5 refills | Status: AC
Start: 2023-05-31 — End: ?

## 2023-05-29 MED ORDER — VALACYCLOVIR HCL 1 G PO TABS: 1000.0000 mg | ORAL_TABLET | Freq: Two times a day (BID) | ORAL | 3 refills | Status: AC

## 2023-05-29 MED ORDER — CYANOCOBALAMIN 1000 MCG/ML IJ SOLN: 1000.0000 ug | INTRAMUSCULAR | 11 refills | Status: AC

## 2023-05-29 NOTE — Addendum Note (Signed)
 Addended by: Martene Skye on: 05/29/2023 09:03 AM   Modules accepted: Orders

## 2023-05-30 ENCOUNTER — Encounter: Payer: Self-pay | Admitting: Certified Nurse Midwife

## 2023-05-30 LAB — VITAMIN D 25 HYDROXY (VIT D DEFICIENCY, FRACTURES): Vit D, 25-Hydroxy: 53 ng/mL (ref 30.0–100.0)

## 2023-05-30 LAB — LIPID PANEL
Chol/HDL Ratio: 3 ratio (ref 0.0–4.4)
Cholesterol, Total: 232 mg/dL — ABNORMAL HIGH (ref 100–199)
HDL: 78 mg/dL (ref >39)
LDL Chol Calc (NIH): 140 mg/dL — ABNORMAL HIGH (ref 0–99)
Triglycerides: 80 mg/dL (ref 0–149)
VLDL Cholesterol Cal: 14 mg/dL (ref 5–40)

## 2023-05-30 LAB — CERVICOVAGINAL ANCILLARY ONLY
Bacterial Vaginitis (gardnerella): NEGATIVE
Candida Glabrata: NEGATIVE
Candida Vaginitis: NEGATIVE
Chlamydia: NEGATIVE
Comment: NEGATIVE
Comment: NEGATIVE
Comment: NEGATIVE
Comment: NEGATIVE
Comment: NEGATIVE
Comment: NORMAL
Neisseria Gonorrhea: NEGATIVE
Trichomonas: NEGATIVE

## 2023-05-30 LAB — VITAMIN B12: Vitamin B-12: 258 pg/mL (ref 232–1245)

## 2023-05-30 LAB — CBC
Hematocrit: 41.9 % (ref 34.0–46.6)
Hemoglobin: 13.3 g/dL (ref 11.1–15.9)
MCH: 31.7 pg (ref 26.6–33.0)
MCHC: 31.7 g/dL (ref 31.5–35.7)
MCV: 100 fL — ABNORMAL HIGH (ref 79–97)
Platelets: 305 10*3/uL (ref 150–450)
RBC: 4.2 x10E6/uL (ref 3.77–5.28)
RDW: 12.6 % (ref 11.7–15.4)
WBC: 4.3 10*3/uL (ref 3.4–10.8)

## 2023-05-30 LAB — HEMOGLOBIN A1C
Est. average glucose Bld gHb Est-mCnc: 100 mg/dL
Hgb A1c MFr Bld: 5.1 % (ref 4.8–5.6)

## 2023-05-30 LAB — COMPREHENSIVE METABOLIC PANEL WITH GFR
ALT: 14 IU/L (ref 0–32)
AST: 14 IU/L (ref 0–40)
Albumin: 4.5 g/dL (ref 3.8–4.9)
Alkaline Phosphatase: 65 IU/L (ref 44–121)
BUN/Creatinine Ratio: 12 (ref 9–23)
BUN: 11 mg/dL (ref 6–24)
Bilirubin Total: 0.4 mg/dL (ref 0.0–1.2)
CO2: 19 mmol/L — ABNORMAL LOW (ref 20–29)
Calcium: 9.6 mg/dL (ref 8.7–10.2)
Chloride: 103 mmol/L (ref 96–106)
Creatinine, Ser: 0.89 mg/dL (ref 0.57–1.00)
Globulin, Total: 2.4 g/dL (ref 1.5–4.5)
Glucose: 81 mg/dL (ref 70–99)
Potassium: 3.8 mmol/L (ref 3.5–5.2)
Sodium: 142 mmol/L (ref 134–144)
Total Protein: 6.9 g/dL (ref 6.0–8.5)
eGFR: 75 mL/min/{1.73_m2} (ref >59)

## 2023-05-30 LAB — TSH+FREE T4
Free T4: 1.24 ng/dL (ref 0.82–1.77)
TSH: 1.65 u[IU]/mL (ref 0.450–4.500)

## 2023-06-21 ENCOUNTER — Ambulatory Visit
Admission: RE | Admit: 2023-06-21 | Discharge: 2023-06-21 | Disposition: A | Discharge status: Home / Self Care | Source: Ambulatory Visit | Attending: Certified Nurse Midwife | Admitting: Certified Nurse Midwife

## 2023-06-21 DIAGNOSIS — Z1231 Encounter for screening mammogram for malignant neoplasm of breast: Secondary | ICD-10-CM | POA: Diagnosis present

## 2023-06-21 DIAGNOSIS — Z01419 Encounter for gynecological examination (general) (routine) without abnormal findings: Secondary | ICD-10-CM | POA: Diagnosis present

## 2023-11-05 ENCOUNTER — Encounter: Payer: Self-pay | Admitting: Dermatology

## 2023-11-05 ENCOUNTER — Ambulatory Visit: Payer: BC Managed Care – PPO | Admitting: Dermatology

## 2023-11-05 DIAGNOSIS — L814 Other melanin hyperpigmentation: Secondary | ICD-10-CM | POA: Diagnosis not present

## 2023-11-05 DIAGNOSIS — L570 Actinic keratosis: Secondary | ICD-10-CM | POA: Diagnosis not present

## 2023-11-05 DIAGNOSIS — D2372 Other benign neoplasm of skin of left lower limb, including hip: Secondary | ICD-10-CM

## 2023-11-05 DIAGNOSIS — L720 Epidermal cyst: Secondary | ICD-10-CM

## 2023-11-05 DIAGNOSIS — L578 Other skin changes due to chronic exposure to nonionizing radiation: Secondary | ICD-10-CM

## 2023-11-05 DIAGNOSIS — L820 Inflamed seborrheic keratosis: Secondary | ICD-10-CM

## 2023-11-05 DIAGNOSIS — L729 Follicular cyst of the skin and subcutaneous tissue, unspecified: Secondary | ICD-10-CM

## 2023-11-05 DIAGNOSIS — D229 Melanocytic nevi, unspecified: Secondary | ICD-10-CM

## 2023-11-05 DIAGNOSIS — D1801 Hemangioma of skin and subcutaneous tissue: Secondary | ICD-10-CM

## 2023-11-05 DIAGNOSIS — D225 Melanocytic nevi of trunk: Secondary | ICD-10-CM

## 2023-11-05 DIAGNOSIS — L821 Other seborrheic keratosis: Secondary | ICD-10-CM

## 2023-11-05 DIAGNOSIS — D2362 Other benign neoplasm of skin of left upper limb, including shoulder: Secondary | ICD-10-CM

## 2023-11-05 DIAGNOSIS — Z1283 Encounter for screening for malignant neoplasm of skin: Secondary | ICD-10-CM

## 2023-11-05 DIAGNOSIS — Z85828 Personal history of other malignant neoplasm of skin: Secondary | ICD-10-CM

## 2023-11-05 MED ORDER — FLUOROURACIL 5 % EX CREA: TOPICAL_CREAM | Freq: Two times a day (BID) | CUTANEOUS | 2 refills | Status: AC

## 2023-11-05 NOTE — Progress Notes (Signed)
 Follow-Up Visit   Subjective  Sylvia Daugherty is a 58 y.o. female who presents for the following: Skin Cancer Screening and Full Body Skin Exam, hx of BCCs, AKs  The patient presents for Total-Body Skin Exam (TBSE) for skin cancer screening and mole check. The patient has spots, moles and lesions to be evaluated, some may be new or changing and the patient may have concern these could be cancer.   The following portions of the chart were reviewed this encounter and updated as appropriate: medications, allergies, medical history  Review of Systems:  No other skin or systemic complaints except as noted in HPI or Assessment and Plan.  Objective  Well appearing patient in no apparent distress; mood and affect are within normal limits.  A full examination was performed including scalp, head, eyes, ears, nose, lips, neck, chest, axillae, abdomen, back, buttocks, bilateral upper extremities, bilateral lower extremities, hands, feet, fingers, toes, fingernails, and toenails. All findings within normal limits unless otherwise noted below.   Relevant physical exam findings are noted in the Assessment and Plan.  L pretibia x 1, nasal root x 1 (2) Stuck on waxy paps with erythema chest x 8, L hand dorsum x 3, mid nasal dorsum x 1, R nasal tip x 1, R nasal dorsum x 1 (14) Pink scaly macules R hand dorsum x 1 Hyperkeratotic scaly macule  Assessment & Plan   SKIN CANCER SCREENING PERFORMED TODAY.  ACTINIC DAMAGE WITH PRECANCEROUS ACTINIC KERATOSES Counseling for Topical Chemotherapy Management: Patient exhibits: - Severe, confluent actinic changes with pre-cancerous actinic keratoses that is secondary to cumulative UV radiation exposure over time - Condition that is severe; chronic, not at goal. - diffuse scaly erythematous macules and papules with underlying dyspigmentation - Discussed Prescription Field Treatment topical Chemotherapy for Severe, Chronic Confluent Actinic Changes with  Pre-Cancerous Actinic Keratoses Field treatment involves treatment of an entire area of skin that has confluent Actinic Changes (Sun/ Ultraviolet light damage) and PreCancerous Actinic Keratoses by method of PhotoDynamic Therapy (PDT) and/or prescription Topical Chemotherapy agents such as 5-fluorouracil , 5-fluorouracil /calcipotriene, and/or imiquimod.  The purpose is to decrease the number of clinically evident and subclinical PreCancerous lesions to prevent progression to development of skin cancer by chemically destroying early precancer changes that may or may not be visible.  It has been shown to reduce the risk of developing skin cancer in the treated area. As a result of treatment, redness, scaling, crusting, and open sores may occur during treatment course. One or more than one of these methods may be used and may have to be used several times to control, suppress and eliminate the PreCancerous changes. Discussed treatment course, expected reaction, and possible side effects. - Recommend daily broad spectrum sunscreen SPF 30+ to sun-exposed areas, reapply every 2 hours as needed.  - Staying in the shade or wearing long sleeves, sun glasses (UVA+UVB protection) and wide brim hats (4-inch brim around the entire circumference of the hat) are also recommended. - Call for new or changing lesions.  - Start 5-fluorouracil /calcipotriene cream twice a day for 7 days to affected areas including chest and dorsum hands, and twice a day for 4 days to affected area of nose. Prescription sent to [facility]. Patient advised they will receive an email to purchase the medication online and have it sent to their home. Patient provided with handout reviewing treatment course and side effects and advised to call or message us  on MyChart with any concerns.  Reviewed course of treatment  and expected reaction.  Patient advised to expect inflammation and crusting and advised that erosions are  possible.  Patient advised to be diligent with sun protection during and after treatment. Counseled to keep medication out of reach of children and pets.   LENTIGINES, SEBORRHEIC KERATOSES, HEMANGIOMAS - Benign normal skin lesions - Benign-appearing - Call for any changes  MELANOCYTIC NEVI - Tan-brown and/or pink-flesh-colored symmetric macules and papules - L lat foot 3.37mm brown macule - L lat mid upper back 3.0 x 2.35mm med dark brown macule - L post shoulder 2.55mm brown macule - Benign appearing on exam today, Stable. - Observation - Call clinic for new or changing moles - Recommend daily use of broad spectrum spf 30+ sunscreen to sun-exposed areas.     EPIDERMAL INCLUSION CYST R upper abdomen Exam: 1.0cm firm subcutaneous nodule at R upper abdomen  Benign-appearing. Exam most consistent with an epidermal inclusion cyst. Discussed that a cyst is a benign growth that can grow over time and sometimes get irritated or inflamed. Recommend observation if it is not bothersome. Discussed option of surgical excision to remove it if it is growing, symptomatic, or other changes noted. Please call for new or changing lesions so they can be evaluated.  LENTIGO vs SK Mid sternum Exam: 0.6cm speckled brown macule darker edge, Benign features under dermoscopy.   Treatment Plan: Benign-appearing. Stable compared to previous visit. Observation.  Call clinic for new or changing moles.  Recommend daily use of broad spectrum spf 30+ sunscreen to sun-exposed areas.   HISTORY OF BASAL CELL CARCINOMA OF THE SKIN - No evidence of recurrence today- L medial canthus, L nasal root, R ear concha, L lower nasal dorsum - Recommend regular full body skin exams - Recommend daily broad spectrum sunscreen SPF 30+ to sun-exposed areas, reapply every 2 hours as needed.  - Call if any new or changing lesions are noted between office visits   INFLAMED SEBORRHEIC KERATOSIS (2) L pretibia x 1, nasal root x 1  (2) Symptomatic, irritating, patient would like treated. Destruction of lesion - L pretibia x 1, nasal root x 1 (2)  Destruction method: cryotherapy   Informed consent: discussed and consent obtained   Lesion destroyed using liquid nitrogen: Yes   Region frozen until ice ball extended beyond lesion: Yes   Outcome: patient tolerated procedure well with no complications   Post-procedure details: wound care instructions given   Additional details:  Prior to procedure, discussed risks of blister formation, small wound, skin dyspigmentation, or rare scar following cryotherapy. Recommend Vaseline ointment to treated areas while healing.   AK (ACTINIC KERATOSIS) (14) chest x 8, L hand dorsum x 3, mid nasal dorsum x 1, R nasal tip x 1, R nasal dorsum x 1 (14) Actinic keratoses are precancerous spots that appear secondary to cumulative UV radiation exposure/sun exposure over time. They are chronic with expected duration over 1 year. A portion of actinic keratoses will progress to squamous cell carcinoma of the skin. It is not possible to reliably predict which spots will progress to skin cancer and so treatment is recommended to prevent development of skin cancer.  Recommend daily broad spectrum sunscreen SPF 30+ to sun-exposed areas, reapply every 2 hours as needed.  Recommend staying in the shade or wearing long sleeves, sun glasses (UVA+UVB protection) and wide brim hats (4-inch brim around the entire circumference of the hat). Call for new or changing lesions. Destruction of lesion - chest x 8, L hand dorsum x 3, mid nasal  dorsum x 1, R nasal tip x 1, R nasal dorsum x 1 (14)  Destruction method: cryotherapy   Informed consent: discussed and consent obtained   Lesion destroyed using liquid nitrogen: Yes   Region frozen until ice ball extended beyond lesion: Yes   Outcome: patient tolerated procedure well with no complications   Post-procedure details: wound care instructions given   Additional  details:  Prior to procedure, discussed risks of blister formation, small wound, skin dyspigmentation, or rare scar following cryotherapy. Recommend Vaseline ointment to treated areas while healing.   HYPERTROPHIC ACTINIC KERATOSIS R hand dorsum x 1 RTC if not resolved with LN2 Destruction of lesion - R hand dorsum x 1  Destruction method: cryotherapy   Informed consent: discussed and consent obtained   Lesion destroyed using liquid nitrogen: Yes   Region frozen until ice ball extended beyond lesion: Yes   Outcome: patient tolerated procedure well with no complications   Post-procedure details: wound care instructions given   Additional details:  Prior to procedure, discussed risks of blister formation, small wound, skin dyspigmentation, or rare scar following cryotherapy. Recommend Vaseline ointment to treated areas while healing.   Return in about 1 year (around 11/04/2024) for TBSE, Hx of BCC, Hx of AKs, recheck R hand dorsum.  I, Grayce Saunas, RMA, am acting as scribe for Rexene Rattler, MD .   Documentation: I have reviewed the above documentation for accuracy and completeness, and I agree with the above.  Rexene Rattler, MD

## 2023-11-05 NOTE — Patient Instructions (Addendum)
 Cryotherapy Aftercare  Wash gently with soap and water  everyday.   Apply Vaseline and Band-Aid daily until healed.    - Start 5-fluorouracil /calcipotriene cream twice a day for 7 days to affected areas including chest and dorsum hands, and twice a day for 4 days to affected area of nose. Prescription sent to [facility]. Patient advised they will receive an email to purchase the medication online and have it sent to their home. Patient provided with handout reviewing treatment course and side effects and advised to call or message us  on MyChart with any concerns.  Instructions for [facility]  One or more of your medications was sent to the [facility] mail order [facility] pharmacy. You will receive an email from them and can purchase the medicine through that link. It will then be mailed to your home at the address you confirmed. If for any reason you do not receive an email from them, please check your spam folder. If you still do not find the email, please let us  know. [facility] phone number is [phone].   5-Fluorouracil /Calcipotriene Patient Education   Actinic keratoses are the dry, red scaly spots on the skin caused by sun damage. A portion of these spots can turn into skin cancer with time, and treating them can help prevent development of skin cancer.   Treatment of these spots requires removal of the defective skin cells. There are various ways to remove actinic keratoses, including freezing with liquid nitrogen, treatment with creams, or treatment with a blue light procedure in the office.   5-fluorouracil  cream is a topical cream used to treat actinic keratoses. It works by interfering with the growth of abnormal fast-growing skin cells, such as actinic keratoses. These cells peel off and are replaced by healthy ones.   5-fluorouracil /calcipotriene is a combination of the 5-fluorouracil  cream with a vitamin D  analog cream called  calcipotriene. The calcipotriene alone does not treat actinic keratoses. However, when it is combined with 5-fluorouracil , it helps the 5-fluorouracil  treat the actinic keratoses much faster so that the same results can be achieved with a much shorter treatment time.  INSTRUCTIONS FOR 5-FLUOROURACIL /CALCIPOTRIENE CREAM:   5-fluorouracil /calcipotriene cream typically only needs to be used for 4-7 days. A thin layer should be applied twice a day to the treatment areas recommended by your physician.   If your physician prescribed you separate tubes of 5-fluourouracil and calcipotriene, apply a thin layer of 5-fluorouracil  followed by a thin layer of calcipotriene.   Avoid contact with your eyes, nostrils, and mouth. Do not use 5-fluorouracil /calcipotriene cream on infected or open wounds.   You will develop redness, irritation and some crusting at areas where you have pre-cancer damage/actinic keratoses. IF YOU DEVELOP PAIN, BLEEDING, OR SIGNIFICANT CRUSTING, STOP THE TREATMENT EARLY - you have already gotten a good response and the actinic keratoses should clear up well.  Wash your hands after applying 5-fluorouracil  5% cream on your skin.   A moisturizer or sunscreen with a minimum SPF 30 should be applied each morning.   Once you have finished the treatment, you can apply a thin layer of Vaseline twice a day to irritated areas to soothe and calm the areas more quickly. If you experience significant discomfort, contact your physician.  For some patients it is necessary to repeat the treatment for best results.  SIDE EFFECTS: When using 5-fluorouracil /calcipotriene cream, you may have mild irritation, such as redness, dryness, swelling, or a mild burning sensation. This usually resolves within 2  weeks. The more actinic keratoses you have, the more redness and inflammation you can expect during treatment. Eye irritation has been reported rarely. If this occurs, please let us  know.  If you have any  trouble using this cream, please call the office. If you have any other questions about this information, please do not hesitate to ask me before you leave the office.   Due to recent changes in healthcare laws, you may see results of your pathology and/or laboratory studies on MyChart before the doctors have had a chance to review them. We understand that in some cases there may be results that are confusing or concerning to you. Please understand that not all results are received at the same time and often the doctors may need to interpret multiple results in order to provide you with the best plan of care or course of treatment. Therefore, we ask that you please give us  2 business days to thoroughly review all your results before contacting the office for clarification. Should we see a critical lab result, you will be contacted sooner.   If You Need Anything After Your Visit  If you have any questions or concerns for your doctor, please call our main line at [phone] and press option 4 to reach your doctor's medical assistant. If no one answers, please leave a voicemail as directed and we will return your call as soon as possible. Messages left after 4 pm will be answered the following business day.   You may also send us  a message via MyChart. We typically respond to MyChart messages within 1-2 business days.  For prescription refills, please ask your pharmacy to contact our office. Our fax number is [phone].  If you have an urgent issue when the clinic is closed that cannot wait until the next business day, you can page your doctor at the number below.    Please note that while we do our best to be available for urgent issues outside of office hours, we are not available 24/7.   If you have an urgent issue and are unable to reach us , you may choose to seek medical care at your doctor's office, retail clinic, urgent care center, or emergency room.  If you have a medical emergency,  please immediately call 911 or go to the emergency department.  Pager Numbers  - Dr. Hester: [phone]  - Dr. Jackquline: [phone]  - Dr. Claudene: [phone]   - Dr. Raymund: [phone]  In the event of inclement weather, please call our main line at [phone] for an update on the status of any delays or closures.  Dermatology Medication Tips: Please keep the boxes that topical medications come in in order to help keep track of the instructions about where and how to use these. Pharmacies typically print the medication instructions only on the boxes and not directly on the medication tubes.   If your medication is too expensive, please contact our office at [phone] option 4 or send us  a message through MyChart.   We are unable to tell what your co-pay for medications will be in advance as this is different depending on your insurance coverage. However, we may be able to find a substitute medication at lower cost or fill out paperwork to get insurance to cover a needed medication.   If a prior authorization is required to get your medication covered by your insurance company, please allow us  1-2 business days to complete this process.  Drug prices often vary depending on  where the prescription is filled and some pharmacies may offer cheaper prices.  The website www.goodrx.com contains coupons for medications through different pharmacies. The prices here do not account for what the cost may be with help from insurance (it may be cheaper with your insurance), but the website can give you the price if you did not use any insurance.  - You can print the associated coupon and take it with your prescription to the pharmacy.  - You may also stop by our office during regular business hours and pick up a GoodRx coupon card.  - If you need your prescription sent electronically to a different pharmacy, notify our office through [facility] or by phone at [phone] option  4.     Si Usted Necesita Algo Despus de Su Visita  Tambin puede enviarnos un mensaje a travs de Clinical Cytogeneticist. Por lo general respondemos a los mensajes de MyChart en el transcurso de 1 a 2 das hbiles.  Para renovar recetas, por favor pida a su farmacia que se ponga en contacto con nuestra oficina. Randi lakes de fax es [place] [phone].  Si tiene un asunto urgente cuando la clnica est cerrada y que no puede esperar hasta el siguiente da hbil, puede llamar/localizar a su doctor(a) al nmero que aparece a continuacin.   Por favor, tenga en cuenta que aunque hacemos todo lo posible para estar disponibles para asuntos urgentes fuera del horario de [place], no estamos disponibles las 24 horas del da, los 7 [address]  [address] de la [place].   Si tiene un problema urgente y no puede comunicarse con nosotros, puede optar por buscar atencin mdica  en el consultorio de su doctor(a), en una clnica privada, en un centro de atencin urgente o en una sala de emergencias.  Si tiene engineer, drilling, por favor llame inmediatamente al 911 o vaya a la sala de emergencias.  Nmeros de bper  - Dr. Hester: [phone]  - Dra. Jackquline: [phone]  - Dr. Claudene: [phone]  - Dra. Kitts: [phone]  En caso de inclemencias del [place], por favor llame a nuestra lnea principal al [phone] para una actualizacin sobre el estado de cualquier retraso o cierre.  Consejos para la medicacin en dermatologa: Por favor, guarde las cajas en las que vienen los medicamentos de uso tpico para ayudarle a seguir las instrucciones sobre dnde y cmo usarlos. Las farmacias generalmente imprimen las instrucciones del medicamento slo en las cajas y no directamente en los tubos del [place].   Si su medicamento es muy caro, por favor, pngase en contacto con landry rieger llamando al [phone] y presione la opcin 4 o envenos un mensaje a travs de Clinical Cytogeneticist.   No podemos decirle cul ser su copago  por los medicamentos por adelantado ya que esto es diferente dependiendo de la cobertura de su seguro. Sin embargo, es posible que podamos encontrar un medicamento sustituto a audiological scientist un formulario para que el seguro cubra el medicamento que se considera necesario.   Si se requiere una autorizacin previa para que su compaa de seguros cubra su medicamento, por favor permtanos de 1 a 2 das hbiles para completar este proceso.  Los precios de los medicamentos varan con frecuencia dependiendo del environmental consultant de dnde se surte la receta y alguna farmacias pueden ofrecer precios ms baratos.  El sitio web www.goodrx.com tiene cupones para medicamentos de health and safety inspector. Los precios aqu no tienen en cuenta lo que podra costar con la ayuda del seguro (puede ser ms barato  con su seguro), pero el sitio web puede darle el precio si no visual merchandiser.  - Puede imprimir el cupn correspondiente y llevarlo con su receta a la farmacia.  - Tambin puede pasar por nuestra oficina durante el horario de atencin regular y education officer, museum una tarjeta de cupones de GoodRx.  - Si necesita que su receta se enve electrnicamente a una farmacia diferente, informe a nuestra oficina a travs de MyChart de [place] o por telfono llamando al [phone] y presione la opcin 4.

## 2024-11-04 ENCOUNTER — Encounter: Admitting: Dermatology
